# Patient Record
Sex: Female | Born: 1945
Health system: Southern US, Community
[De-identification: ages and names within clinical notes are randomized; demographics above are authoritative.]

## PROBLEM LIST (undated history)

## (undated) DIAGNOSIS — F209 Schizophrenia, unspecified: Secondary | ICD-10-CM

## (undated) DIAGNOSIS — F329 Major depressive disorder, single episode, unspecified: Secondary | ICD-10-CM

## (undated) DIAGNOSIS — F32A Depression, unspecified: Secondary | ICD-10-CM

## (undated) DIAGNOSIS — E7800 Pure hypercholesterolemia, unspecified: Secondary | ICD-10-CM

## (undated) DIAGNOSIS — I10 Essential (primary) hypertension: Secondary | ICD-10-CM

## (undated) HISTORY — PX: ABDOMINAL HYSTERECTOMY: SHX81

## (undated) HISTORY — PX: TOOTH EXTRACTION: SUR596

---

## 2003-01-01 ENCOUNTER — Encounter: Payer: Self-pay | Admitting: Cardiology

## 2003-01-01 ENCOUNTER — Encounter: Admission: RE | Admit: 2003-01-01 | Discharge: 2003-01-01 | Payer: Self-pay | Admitting: Cardiology

## 2004-05-21 ENCOUNTER — Emergency Department (HOSPITAL_COMMUNITY): Admission: EM | Admit: 2004-05-21 | Discharge: 2004-05-21 | Payer: Self-pay | Admitting: Emergency Medicine

## 2007-04-20 ENCOUNTER — Encounter: Admission: RE | Admit: 2007-04-20 | Discharge: 2007-04-20 | Payer: Self-pay | Admitting: Internal Medicine

## 2007-05-03 ENCOUNTER — Encounter: Admission: RE | Admit: 2007-05-03 | Discharge: 2007-05-03 | Payer: Self-pay | Admitting: Internal Medicine

## 2008-05-23 ENCOUNTER — Encounter: Admission: RE | Admit: 2008-05-23 | Discharge: 2008-05-23 | Payer: Self-pay | Admitting: Internal Medicine

## 2010-07-17 ENCOUNTER — Encounter: Admission: RE | Admit: 2010-07-17 | Discharge: 2010-07-17 | Payer: Self-pay | Admitting: Internal Medicine

## 2010-07-31 ENCOUNTER — Encounter: Admission: RE | Admit: 2010-07-31 | Discharge: 2010-07-31 | Payer: Self-pay | Admitting: Internal Medicine

## 2010-10-05 ENCOUNTER — Encounter: Payer: Self-pay | Admitting: Internal Medicine

## 2011-09-16 DIAGNOSIS — F209 Schizophrenia, unspecified: Secondary | ICD-10-CM | POA: Diagnosis not present

## 2012-02-17 DIAGNOSIS — Z833 Family history of diabetes mellitus: Secondary | ICD-10-CM | POA: Diagnosis not present

## 2012-02-17 DIAGNOSIS — I10 Essential (primary) hypertension: Secondary | ICD-10-CM | POA: Diagnosis not present

## 2012-02-17 DIAGNOSIS — E119 Type 2 diabetes mellitus without complications: Secondary | ICD-10-CM | POA: Diagnosis not present

## 2012-02-17 DIAGNOSIS — Z79899 Other long term (current) drug therapy: Secondary | ICD-10-CM | POA: Diagnosis not present

## 2012-02-17 DIAGNOSIS — E782 Mixed hyperlipidemia: Secondary | ICD-10-CM | POA: Diagnosis not present

## 2012-02-23 DIAGNOSIS — F209 Schizophrenia, unspecified: Secondary | ICD-10-CM | POA: Diagnosis not present

## 2012-06-02 DIAGNOSIS — F209 Schizophrenia, unspecified: Secondary | ICD-10-CM | POA: Diagnosis not present

## 2012-06-15 DIAGNOSIS — F209 Schizophrenia, unspecified: Secondary | ICD-10-CM | POA: Diagnosis not present

## 2012-06-17 ENCOUNTER — Emergency Department (HOSPITAL_COMMUNITY)
Admission: EM | Admit: 2012-06-17 | Discharge: 2012-06-18 | Disposition: A | Discharge status: Other Acute Inpatient Hospital | Payer: Medicare Other | Attending: Emergency Medicine | Admitting: Emergency Medicine

## 2012-06-17 ENCOUNTER — Encounter (HOSPITAL_COMMUNITY): Payer: Self-pay

## 2012-06-17 DIAGNOSIS — I10 Essential (primary) hypertension: Secondary | ICD-10-CM | POA: Insufficient documentation

## 2012-06-17 DIAGNOSIS — E119 Type 2 diabetes mellitus without complications: Secondary | ICD-10-CM | POA: Insufficient documentation

## 2012-06-17 DIAGNOSIS — F411 Generalized anxiety disorder: Secondary | ICD-10-CM | POA: Insufficient documentation

## 2012-06-17 DIAGNOSIS — R45851 Suicidal ideations: Secondary | ICD-10-CM | POA: Diagnosis not present

## 2012-06-17 DIAGNOSIS — F489 Nonpsychotic mental disorder, unspecified: Secondary | ICD-10-CM | POA: Diagnosis not present

## 2012-06-17 DIAGNOSIS — Z79899 Other long term (current) drug therapy: Secondary | ICD-10-CM | POA: Insufficient documentation

## 2012-06-17 DIAGNOSIS — F329 Major depressive disorder, single episode, unspecified: Secondary | ICD-10-CM | POA: Diagnosis not present

## 2012-06-17 DIAGNOSIS — F3289 Other specified depressive episodes: Secondary | ICD-10-CM | POA: Insufficient documentation

## 2012-06-17 HISTORY — DX: Essential (primary) hypertension: I10

## 2012-06-17 HISTORY — DX: Major depressive disorder, single episode, unspecified: F32.9

## 2012-06-17 HISTORY — DX: Depression, unspecified: F32.A

## 2012-06-17 LAB — CBC WITH DIFFERENTIAL/PLATELET
Hemoglobin: 12.9 g/dL (ref 12.0–15.0)
Lymphocytes Relative: 28 % (ref 12–46)
Lymphs Abs: 1 10*3/uL (ref 0.7–4.0)
MCH: 29.1 pg (ref 26.0–34.0)
Monocytes Relative: 11 % (ref 3–12)
Neutro Abs: 2.2 10*3/uL (ref 1.7–7.7)
Neutrophils Relative %: 59 % (ref 43–77)
Platelets: 267 10*3/uL (ref 150–400)
RBC: 4.44 MIL/uL (ref 3.87–5.11)
WBC: 3.7 10*3/uL — ABNORMAL LOW (ref 4.0–10.5)

## 2012-06-17 LAB — ETHANOL: Alcohol, Ethyl (B): <11 mg/dL (ref 0–11)

## 2012-06-17 LAB — RAPID URINE DRUG SCREEN, HOSP PERFORMED
Amphetamines: NOT DETECTED
Opiates: NOT DETECTED

## 2012-06-17 LAB — GLUCOSE, CAPILLARY: Glucose-Capillary: 118 mg/dL — ABNORMAL HIGH (ref 70–99)

## 2012-06-17 LAB — COMPREHENSIVE METABOLIC PANEL
ALT: 13 U/L (ref 0–35)
Alkaline Phosphatase: 44 U/L (ref 39–117)
BUN: 13 mg/dL (ref 6–23)
CO2: 28 mEq/L (ref 19–32)
Chloride: 104 mEq/L (ref 96–112)
GFR calc Af Amer: >90 mL/min (ref >90)
Glucose, Bld: 119 mg/dL — ABNORMAL HIGH (ref 70–99)
Potassium: 3.5 mEq/L (ref 3.5–5.1)
Sodium: 140 mEq/L (ref 135–145)
Total Bilirubin: 0.2 mg/dL — ABNORMAL LOW (ref 0.3–1.2)
Total Protein: 7.9 g/dL (ref 6.0–8.3)

## 2012-06-17 MED ORDER — INSULIN ASPART 100 UNIT/ML ~~LOC~~ SOLN: 0.0000 [IU] | Freq: Three times a day (TID) | SUBCUTANEOUS | Status: DC

## 2012-06-17 MED ORDER — LISINOPRIL 10 MG PO TABS
10.0000 mg | ORAL_TABLET | Freq: Every day | ORAL | Status: DC
  Administered 2012-06-18: 10 mg via ORAL
  Filled 2012-06-17: qty 1

## 2012-06-17 MED ORDER — HYDROCHLOROTHIAZIDE 25 MG PO TABS
25.0000 mg | ORAL_TABLET | Freq: Every day | ORAL | Status: DC
  Administered 2012-06-18: 25 mg via ORAL
  Filled 2012-06-17: qty 1

## 2012-06-17 MED ORDER — IBUPROFEN 600 MG PO TABS: 600.0000 mg | ORAL_TABLET | Freq: Three times a day (TID) | ORAL | Status: DC | PRN

## 2012-06-17 MED ORDER — LISINOPRIL-HYDROCHLOROTHIAZIDE 10-12.5 MG PO TABS: 1.0000 | ORAL_TABLET | Freq: Every day | ORAL | Status: DC

## 2012-06-17 MED ORDER — INSULIN ASPART 100 UNIT/ML ~~LOC~~ SOLN: 0.0000 [IU] | Freq: Every day | SUBCUTANEOUS | Status: DC

## 2012-06-17 MED ORDER — PIOGLITAZONE HCL 30 MG PO TABS
30.0000 mg | ORAL_TABLET | Freq: Every day | ORAL | Status: DC
  Administered 2012-06-18: 30 mg via ORAL
  Filled 2012-06-17: qty 1

## 2012-06-17 MED ORDER — LORAZEPAM 1 MG PO TABS: 1.0000 mg | ORAL_TABLET | Freq: Three times a day (TID) | ORAL | Status: DC | PRN

## 2012-06-17 MED ORDER — ACETAMINOPHEN 325 MG PO TABS: 650.0000 mg | ORAL_TABLET | ORAL | Status: DC | PRN

## 2012-06-17 MED ORDER — SIMVASTATIN 40 MG PO TABS
40.0000 mg | ORAL_TABLET | Freq: Every evening | ORAL | Status: DC
  Filled 2012-06-17: qty 1

## 2012-06-17 NOTE — ED Notes (Signed)
1 pt belongings bag moved to locker #35
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
## 2012-06-17 NOTE — ED Notes (Signed)
Telepsych notified of need for consult. Information faxed over.
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
## 2012-06-17 NOTE — ED Notes (Signed)
telepsych has been completed. 
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
## 2012-06-17 NOTE — ED Provider Notes (Signed)
Telepsych recommended inpatient psych. Appreciate recs.   Richardean Canal, MD 06/17/12 2226
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
## 2012-06-17 NOTE — ED Notes (Signed)
Pt is placed in scrubs and being wanded by security. [employer] at bedside

## 2012-06-17 NOTE — ED Provider Notes (Signed)
Medical screening examination/treatment/procedure(s) were performed by non-physician practitioner and as supervising physician I was immediately available for consultation/collaboration.   Richardean Canal, MD 06/17/12 [phone]

## 2012-06-18 DIAGNOSIS — E119 Type 2 diabetes mellitus without complications: Secondary | ICD-10-CM | POA: Diagnosis present

## 2012-06-18 DIAGNOSIS — I10 Essential (primary) hypertension: Secondary | ICD-10-CM | POA: Diagnosis not present

## 2012-06-18 DIAGNOSIS — R45850 Homicidal ideations: Secondary | ICD-10-CM | POA: Diagnosis not present

## 2012-06-18 DIAGNOSIS — F200 Paranoid schizophrenia: Secondary | ICD-10-CM | POA: Diagnosis not present

## 2012-06-18 DIAGNOSIS — K5900 Constipation, unspecified: Secondary | ICD-10-CM | POA: Diagnosis present

## 2012-06-18 DIAGNOSIS — Z9119 Patient's noncompliance with other medical treatment and regimen: Secondary | ICD-10-CM | POA: Diagnosis not present

## 2012-06-18 DIAGNOSIS — Z658 Other specified problems related to psychosocial circumstances: Secondary | ICD-10-CM | POA: Diagnosis not present

## 2012-06-18 DIAGNOSIS — E7800 Pure hypercholesterolemia, unspecified: Secondary | ICD-10-CM | POA: Diagnosis present

## 2012-06-18 LAB — GLUCOSE, CAPILLARY
Glucose-Capillary: 104 mg/dL — ABNORMAL HIGH (ref 70–99)
Glucose-Capillary: 107 mg/dL — ABNORMAL HIGH (ref 70–99)

## 2012-06-18 NOTE — ED Notes (Signed)
Sheriff's dept contacted for transportation

## 2012-06-18 NOTE — ED Notes (Signed)
Message left for patients niece to call- verbal permission from pt to tell her niece where she has been transfered too.

## 2012-06-18 NOTE — ED Notes (Signed)
Sheriff will transport around 3-4:00pm-pt aware

## 2012-06-18 NOTE — ED Provider Notes (Signed)
She has been accepted at [facility] by Dr. Mauri Pole.  Flint Melter, MD 06/18/12 [phone]

## 2012-06-18 NOTE — ED Notes (Signed)
Up to the bathroom 

## 2012-06-18 NOTE — BHH Counselor (Signed)
Per Gunnar Fusi, pt has been accepted at [facility] by Dr. Mauri Pole

## 2012-06-18 NOTE — ED Notes (Addendum)
Sheriff is here to transport-1 bag of belongings sent w/ pt.  Pt ambulatory w/ [facility] to [facility]. No c/o of pain discomfort

## 2012-06-18 NOTE — ED Notes (Signed)
This writer attempted to contact pt's niece at her request to inform her of transfer (pt lives w/ niece and does not know the phohe number). Number listed is not in service

## 2012-06-18 NOTE — ED Notes (Signed)
Pt's niece phone [phone]

## 2012-07-05 DIAGNOSIS — F209 Schizophrenia, unspecified: Secondary | ICD-10-CM | POA: Diagnosis not present

## 2012-07-13 DIAGNOSIS — F209 Schizophrenia, unspecified: Secondary | ICD-10-CM | POA: Diagnosis not present

## 2012-08-26 DIAGNOSIS — F209 Schizophrenia, unspecified: Secondary | ICD-10-CM | POA: Diagnosis not present

## 2012-10-19 DIAGNOSIS — F209 Schizophrenia, unspecified: Secondary | ICD-10-CM | POA: Diagnosis not present

## 2012-11-02 DIAGNOSIS — F209 Schizophrenia, unspecified: Secondary | ICD-10-CM | POA: Diagnosis not present

## 2012-11-16 DIAGNOSIS — F209 Schizophrenia, unspecified: Secondary | ICD-10-CM | POA: Diagnosis not present

## 2012-11-21 DIAGNOSIS — Z0000 Encounter for general adult medical examination without abnormal findings: Secondary | ICD-10-CM | POA: Diagnosis not present

## 2012-11-21 DIAGNOSIS — Z79899 Other long term (current) drug therapy: Secondary | ICD-10-CM | POA: Diagnosis not present

## 2012-11-21 DIAGNOSIS — E559 Vitamin D deficiency, unspecified: Secondary | ICD-10-CM | POA: Diagnosis not present

## 2012-11-21 DIAGNOSIS — N3945 Continuous leakage: Secondary | ICD-10-CM | POA: Diagnosis not present

## 2012-12-02 DIAGNOSIS — F209 Schizophrenia, unspecified: Secondary | ICD-10-CM | POA: Diagnosis not present

## 2012-12-14 DIAGNOSIS — F209 Schizophrenia, unspecified: Secondary | ICD-10-CM | POA: Diagnosis not present

## 2012-12-21 DIAGNOSIS — F209 Schizophrenia, unspecified: Secondary | ICD-10-CM | POA: Diagnosis not present

## 2012-12-28 DIAGNOSIS — F209 Schizophrenia, unspecified: Secondary | ICD-10-CM | POA: Diagnosis not present

## 2013-01-03 DIAGNOSIS — Z1211 Encounter for screening for malignant neoplasm of colon: Secondary | ICD-10-CM | POA: Diagnosis not present

## 2013-01-03 DIAGNOSIS — K5900 Constipation, unspecified: Secondary | ICD-10-CM | POA: Diagnosis not present

## 2013-01-11 DIAGNOSIS — F209 Schizophrenia, unspecified: Secondary | ICD-10-CM | POA: Diagnosis not present

## 2013-01-25 DIAGNOSIS — F209 Schizophrenia, unspecified: Secondary | ICD-10-CM | POA: Diagnosis not present

## 2013-02-08 DIAGNOSIS — F209 Schizophrenia, unspecified: Secondary | ICD-10-CM | POA: Diagnosis not present

## 2013-02-21 DIAGNOSIS — E785 Hyperlipidemia, unspecified: Secondary | ICD-10-CM | POA: Diagnosis not present

## 2013-02-21 DIAGNOSIS — E119 Type 2 diabetes mellitus without complications: Secondary | ICD-10-CM | POA: Diagnosis not present

## 2013-02-21 DIAGNOSIS — Z79899 Other long term (current) drug therapy: Secondary | ICD-10-CM | POA: Diagnosis not present

## 2013-02-21 DIAGNOSIS — I10 Essential (primary) hypertension: Secondary | ICD-10-CM | POA: Diagnosis not present

## 2013-02-21 DIAGNOSIS — E559 Vitamin D deficiency, unspecified: Secondary | ICD-10-CM | POA: Diagnosis not present

## 2013-02-22 DIAGNOSIS — F209 Schizophrenia, unspecified: Secondary | ICD-10-CM | POA: Diagnosis not present

## 2013-03-08 DIAGNOSIS — F209 Schizophrenia, unspecified: Secondary | ICD-10-CM | POA: Diagnosis not present

## 2013-03-22 DIAGNOSIS — F209 Schizophrenia, unspecified: Secondary | ICD-10-CM | POA: Diagnosis not present

## 2013-04-05 DIAGNOSIS — F209 Schizophrenia, unspecified: Secondary | ICD-10-CM | POA: Diagnosis not present

## 2013-04-19 DIAGNOSIS — F209 Schizophrenia, unspecified: Secondary | ICD-10-CM | POA: Diagnosis not present

## 2013-05-03 DIAGNOSIS — F209 Schizophrenia, unspecified: Secondary | ICD-10-CM | POA: Diagnosis not present

## 2013-05-17 DIAGNOSIS — F209 Schizophrenia, unspecified: Secondary | ICD-10-CM | POA: Diagnosis not present

## 2013-05-31 DIAGNOSIS — F209 Schizophrenia, unspecified: Secondary | ICD-10-CM | POA: Diagnosis not present

## 2013-06-14 DIAGNOSIS — F209 Schizophrenia, unspecified: Secondary | ICD-10-CM | POA: Diagnosis not present

## 2013-06-28 DIAGNOSIS — F209 Schizophrenia, unspecified: Secondary | ICD-10-CM | POA: Diagnosis not present

## 2013-07-12 DIAGNOSIS — F209 Schizophrenia, unspecified: Secondary | ICD-10-CM | POA: Diagnosis not present

## 2013-07-26 DIAGNOSIS — F209 Schizophrenia, unspecified: Secondary | ICD-10-CM | POA: Diagnosis not present

## 2013-08-09 DIAGNOSIS — F209 Schizophrenia, unspecified: Secondary | ICD-10-CM | POA: Diagnosis not present

## 2013-08-22 DIAGNOSIS — F209 Schizophrenia, unspecified: Secondary | ICD-10-CM | POA: Diagnosis not present

## 2013-09-06 DIAGNOSIS — F209 Schizophrenia, unspecified: Secondary | ICD-10-CM | POA: Diagnosis not present

## 2013-09-19 DIAGNOSIS — E785 Hyperlipidemia, unspecified: Secondary | ICD-10-CM | POA: Diagnosis not present

## 2013-09-19 DIAGNOSIS — Z79899 Other long term (current) drug therapy: Secondary | ICD-10-CM | POA: Diagnosis not present

## 2013-09-19 DIAGNOSIS — I10 Essential (primary) hypertension: Secondary | ICD-10-CM | POA: Diagnosis not present

## 2013-09-19 DIAGNOSIS — E559 Vitamin D deficiency, unspecified: Secondary | ICD-10-CM | POA: Diagnosis not present

## 2013-09-19 DIAGNOSIS — E119 Type 2 diabetes mellitus without complications: Secondary | ICD-10-CM | POA: Diagnosis not present

## 2013-09-20 DIAGNOSIS — F209 Schizophrenia, unspecified: Secondary | ICD-10-CM | POA: Diagnosis not present

## 2013-10-04 DIAGNOSIS — F209 Schizophrenia, unspecified: Secondary | ICD-10-CM | POA: Diagnosis not present

## 2013-10-18 DIAGNOSIS — F209 Schizophrenia, unspecified: Secondary | ICD-10-CM | POA: Diagnosis not present

## 2013-11-01 DIAGNOSIS — F209 Schizophrenia, unspecified: Secondary | ICD-10-CM | POA: Diagnosis not present

## 2013-11-15 DIAGNOSIS — F209 Schizophrenia, unspecified: Secondary | ICD-10-CM | POA: Diagnosis not present

## 2013-12-01 DIAGNOSIS — F209 Schizophrenia, unspecified: Secondary | ICD-10-CM | POA: Diagnosis not present

## 2013-12-13 DIAGNOSIS — F209 Schizophrenia, unspecified: Secondary | ICD-10-CM | POA: Diagnosis not present

## 2013-12-27 DIAGNOSIS — F209 Schizophrenia, unspecified: Secondary | ICD-10-CM | POA: Diagnosis not present

## 2014-01-10 DIAGNOSIS — F209 Schizophrenia, unspecified: Secondary | ICD-10-CM | POA: Diagnosis not present

## 2014-01-16 DIAGNOSIS — E782 Mixed hyperlipidemia: Secondary | ICD-10-CM | POA: Diagnosis not present

## 2014-01-16 DIAGNOSIS — E119 Type 2 diabetes mellitus without complications: Secondary | ICD-10-CM | POA: Diagnosis not present

## 2014-01-16 DIAGNOSIS — Z1239 Encounter for other screening for malignant neoplasm of breast: Secondary | ICD-10-CM | POA: Diagnosis not present

## 2014-01-16 DIAGNOSIS — E559 Vitamin D deficiency, unspecified: Secondary | ICD-10-CM | POA: Diagnosis not present

## 2014-01-16 DIAGNOSIS — I10 Essential (primary) hypertension: Secondary | ICD-10-CM | POA: Diagnosis not present

## 2014-01-16 DIAGNOSIS — Z0000 Encounter for general adult medical examination without abnormal findings: Secondary | ICD-10-CM | POA: Diagnosis not present

## 2014-01-24 DIAGNOSIS — F209 Schizophrenia, unspecified: Secondary | ICD-10-CM | POA: Diagnosis not present

## 2014-02-07 DIAGNOSIS — F209 Schizophrenia, unspecified: Secondary | ICD-10-CM | POA: Diagnosis not present

## 2014-02-21 DIAGNOSIS — F209 Schizophrenia, unspecified: Secondary | ICD-10-CM | POA: Diagnosis not present

## 2014-03-07 DIAGNOSIS — F209 Schizophrenia, unspecified: Secondary | ICD-10-CM | POA: Diagnosis not present

## 2014-03-21 DIAGNOSIS — F209 Schizophrenia, unspecified: Secondary | ICD-10-CM | POA: Diagnosis not present

## 2014-04-04 DIAGNOSIS — F209 Schizophrenia, unspecified: Secondary | ICD-10-CM | POA: Diagnosis not present

## 2014-04-18 DIAGNOSIS — F209 Schizophrenia, unspecified: Secondary | ICD-10-CM | POA: Diagnosis not present

## 2014-05-01 DIAGNOSIS — F209 Schizophrenia, unspecified: Secondary | ICD-10-CM | POA: Diagnosis not present

## 2014-05-15 DIAGNOSIS — F209 Schizophrenia, unspecified: Secondary | ICD-10-CM | POA: Diagnosis not present

## 2014-06-12 DIAGNOSIS — F209 Schizophrenia, unspecified: Secondary | ICD-10-CM | POA: Diagnosis not present

## 2014-07-18 DIAGNOSIS — Z79899 Other long term (current) drug therapy: Secondary | ICD-10-CM | POA: Diagnosis not present

## 2014-07-18 DIAGNOSIS — E119 Type 2 diabetes mellitus without complications: Secondary | ICD-10-CM | POA: Diagnosis not present

## 2014-07-18 DIAGNOSIS — E782 Mixed hyperlipidemia: Secondary | ICD-10-CM | POA: Diagnosis not present

## 2014-07-18 DIAGNOSIS — Z23 Encounter for immunization: Secondary | ICD-10-CM | POA: Diagnosis not present

## 2014-07-18 DIAGNOSIS — R635 Abnormal weight gain: Secondary | ICD-10-CM | POA: Diagnosis not present

## 2014-07-18 DIAGNOSIS — I10 Essential (primary) hypertension: Secondary | ICD-10-CM | POA: Diagnosis not present

## 2014-08-28 DIAGNOSIS — F209 Schizophrenia, unspecified: Secondary | ICD-10-CM | POA: Diagnosis not present

## 2014-10-19 DIAGNOSIS — I10 Essential (primary) hypertension: Secondary | ICD-10-CM | POA: Diagnosis not present

## 2014-10-19 DIAGNOSIS — F209 Schizophrenia, unspecified: Secondary | ICD-10-CM | POA: Diagnosis not present

## 2014-10-19 DIAGNOSIS — E119 Type 2 diabetes mellitus without complications: Secondary | ICD-10-CM | POA: Diagnosis not present

## 2014-10-19 DIAGNOSIS — Z79899 Other long term (current) drug therapy: Secondary | ICD-10-CM | POA: Diagnosis not present

## 2014-10-19 DIAGNOSIS — E782 Mixed hyperlipidemia: Secondary | ICD-10-CM | POA: Diagnosis not present

## 2014-11-19 DIAGNOSIS — F209 Schizophrenia, unspecified: Secondary | ICD-10-CM | POA: Diagnosis not present

## 2015-01-28 ENCOUNTER — Encounter (HOSPITAL_COMMUNITY): Payer: Self-pay | Admitting: Emergency Medicine

## 2015-01-28 ENCOUNTER — Emergency Department (HOSPITAL_COMMUNITY)
Admission: EM | Admit: 2015-01-28 | Discharge: 2015-01-30 | Disposition: A | Discharge status: Other Acute Inpatient Hospital | Payer: Medicare Other | Attending: Emergency Medicine | Admitting: Emergency Medicine

## 2015-01-28 DIAGNOSIS — I10 Essential (primary) hypertension: Secondary | ICD-10-CM | POA: Insufficient documentation

## 2015-01-28 DIAGNOSIS — Z008 Encounter for other general examination: Secondary | ICD-10-CM | POA: Diagnosis present

## 2015-01-28 DIAGNOSIS — Z79899 Other long term (current) drug therapy: Secondary | ICD-10-CM | POA: Diagnosis not present

## 2015-01-28 DIAGNOSIS — F329 Major depressive disorder, single episode, unspecified: Secondary | ICD-10-CM | POA: Diagnosis not present

## 2015-01-28 DIAGNOSIS — F1310 Sedative, hypnotic or anxiolytic abuse, uncomplicated: Secondary | ICD-10-CM | POA: Insufficient documentation

## 2015-01-28 DIAGNOSIS — F259 Schizoaffective disorder, unspecified: Secondary | ICD-10-CM

## 2015-01-28 DIAGNOSIS — F209 Schizophrenia, unspecified: Secondary | ICD-10-CM | POA: Diagnosis not present

## 2015-01-28 DIAGNOSIS — R440 Auditory hallucinations: Secondary | ICD-10-CM

## 2015-01-28 DIAGNOSIS — E119 Type 2 diabetes mellitus without complications: Secondary | ICD-10-CM | POA: Diagnosis not present

## 2015-01-28 DIAGNOSIS — E780 Pure hypercholesterolemia: Secondary | ICD-10-CM | POA: Diagnosis not present

## 2015-01-28 DIAGNOSIS — F0390 Unspecified dementia without behavioral disturbance: Secondary | ICD-10-CM

## 2015-01-28 DIAGNOSIS — F4489 Other dissociative and conversion disorders: Secondary | ICD-10-CM | POA: Diagnosis not present

## 2015-01-28 DIAGNOSIS — I6789 Other cerebrovascular disease: Secondary | ICD-10-CM | POA: Diagnosis not present

## 2015-01-28 DIAGNOSIS — F29 Unspecified psychosis not due to a substance or known physiological condition: Secondary | ICD-10-CM | POA: Diagnosis not present

## 2015-01-28 HISTORY — DX: Pure hypercholesterolemia, unspecified: E78.00

## 2015-01-28 LAB — ACETAMINOPHEN LEVEL: Acetaminophen (Tylenol), Serum: <10 ug/mL — ABNORMAL LOW (ref 10–30)

## 2015-01-28 LAB — COMPREHENSIVE METABOLIC PANEL
ALBUMIN: 3.5 g/dL (ref 3.5–5.0)
ALK PHOS: 40 U/L (ref 38–126)
ALT: 15 U/L (ref 14–54)
AST: 16 U/L (ref 15–41)
Anion gap: 8 (ref 5–15)
BILIRUBIN TOTAL: 0.5 mg/dL (ref 0.3–1.2)
BUN: 20 mg/dL (ref 6–20)
CALCIUM: 9.6 mg/dL (ref 8.9–10.3)
CO2: 27 mmol/L (ref 22–32)
CREATININE: 0.85 mg/dL (ref 0.44–1.00)
Chloride: 105 mmol/L (ref 101–111)
GFR calc Af Amer: >60 mL/min (ref >60)
GFR calc non Af Amer: >60 mL/min (ref >60)
Glucose, Bld: 110 mg/dL — ABNORMAL HIGH (ref 65–99)
Potassium: 3.6 mmol/L (ref 3.5–5.1)
Sodium: 140 mmol/L (ref 135–145)
TOTAL PROTEIN: 7.7 g/dL (ref 6.5–8.1)

## 2015-01-28 LAB — CBC
HEMATOCRIT: 38.9 % (ref 36.0–46.0)
HEMOGLOBIN: 12.1 g/dL (ref 12.0–15.0)
MCH: 28.4 pg (ref 26.0–34.0)
MCHC: 31.1 g/dL (ref 30.0–36.0)
MCV: 91.3 fL (ref 78.0–100.0)
Platelets: 204 10*3/uL (ref 150–400)
RBC: 4.26 MIL/uL (ref 3.87–5.11)
RDW: 15.3 % (ref 11.5–15.5)
WBC: 4 10*3/uL (ref 4.0–10.5)

## 2015-01-28 LAB — ETHANOL: Alcohol, Ethyl (B): <5 mg/dL (ref <5)

## 2015-01-28 LAB — SALICYLATE LEVEL

## 2015-01-28 LAB — RAPID URINE DRUG SCREEN, HOSP PERFORMED
Amphetamines: NOT DETECTED
BENZODIAZEPINES: POSITIVE — AB
Barbiturates: NOT DETECTED
COCAINE: NOT DETECTED
OPIATES: NOT DETECTED
Tetrahydrocannabinol: NOT DETECTED

## 2015-01-28 MED ORDER — NICOTINE 21 MG/24HR TD PT24: 21.0000 mg | MEDICATED_PATCH | Freq: Every day | TRANSDERMAL | Status: DC

## 2015-01-28 MED ORDER — ONDANSETRON HCL 4 MG PO TABS: 4.0000 mg | ORAL_TABLET | Freq: Three times a day (TID) | ORAL | Status: DC | PRN

## 2015-01-28 MED ORDER — IBUPROFEN 200 MG PO TABS: 600.0000 mg | ORAL_TABLET | Freq: Three times a day (TID) | ORAL | Status: DC | PRN

## 2015-01-28 MED ORDER — ALUM & MAG HYDROXIDE-SIMETH 200-200-20 MG/5ML PO SUSP: 30.0000 mL | ORAL | Status: DC | PRN

## 2015-01-28 MED ORDER — ACETAMINOPHEN 325 MG PO TABS: 650.0000 mg | ORAL_TABLET | ORAL | Status: DC | PRN

## 2015-01-28 MED ORDER — LORAZEPAM 1 MG PO TABS: 1.0000 mg | ORAL_TABLET | Freq: Three times a day (TID) | ORAL | Status: DC | PRN

## 2015-01-28 MED ORDER — ZOLPIDEM TARTRATE 5 MG PO TABS: 5.0000 mg | ORAL_TABLET | Freq: Every evening | ORAL | Status: DC | PRN
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
## 2015-01-28 NOTE — ED Notes (Signed)
Bed: [id number] Expected date:  Expected time:  Means of arrival:  Comments: EMS 69 yo female IVC
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
## 2015-01-28 NOTE — ED Notes (Signed)
Per EMS-patient reports patient has been more aggressive today. Hx schizophrenia. Last time patient was under IVC was two years ago. On arrival patient did not want to go, was handcuffed and placed on bed. Calm with EMS en route. Alert but disoriented. Pt from home-lives with family members. VS: BP 134/76 SPO2 100% on RA. HR 92 CBG 106 mg/dl. Hx DM.

## 2015-01-28 NOTE — ED Provider Notes (Signed)
CSN: [id number]     Arrival date & time 01/28/15  2044 History   First MD Initiated Contact with Patient 01/28/15 2047     Chief Complaint  Patient presents with  . IVC      (Consider location/radiation/quality/duration/timing/severity/associated sxs/prior Treatment) The history is provided by the patient and a relative.     Patient with hx schizophrenia, depression brought in by police under IVC from mobile crisis for auditory hallucinations, bizarre behavior, aggression increasing over the past two weeks.   Niece provides history.  Niece states the patient lives with her, has beengetting dressed and leaving the house in the middle of the night stating there is someone downstairs talking to her (there is no downstairs in their house).  She also states her husband is downstairs making love to another woman and her children are downstairs.  (not only is there no downstairs, the patient has no husband or children).  Niece has heard patient screaming in outbursts in her room and cursing.  Today she became more hostile and angry, becoming physically violent in the house, refused to go to the doctor.    Pt denies any pain, any depression or SI.  Denies any physical symptoms currently.    Past Medical History  Diagnosis Date  . Depression   . Hypertension   . Diabetes mellitus   . Hypercholesterolemia    Past Surgical History  Procedure Laterality Date  . Abdominal hysterectomy    . Tooth extraction     History reviewed. No pertinent family history. History  Substance Use Topics  . Smoking status: Never Smoker   . Smokeless tobacco: Not on file  . Alcohol Use: No   OB History    No data available     Review of Systems  All other systems reviewed and are negative.     Allergies  Review of patient's allergies indicates no known allergies.  Home Medications   Prior to Admission medications   Medication Sig Start Date End Date Taking? Authorizing Provider  benztropine  (COGENTIN) 1 MG tablet Take 1 mg by mouth at bedtime.    Historical Provider, MD  FLUoxetine (PROZAC) 10 MG capsule Take 10-20 mg by mouth at bedtime.    Historical Provider, MD  hydrochlorothiazide (HYDRODIURIL) 25 MG tablet Take 25 mg by mouth daily.    Historical Provider, MD  lisinopril-hydrochlorothiazide (PRINZIDE,ZESTORETIC) 10-12.5 MG per tablet Take 1 tablet by mouth daily.    Historical Provider, MD  pioglitazone (ACTOS) 30 MG tablet Take 30 mg by mouth daily.    Historical Provider, MD  simvastatin (ZOCOR) 40 MG tablet Take 40 mg by mouth every evening.    Historical Provider, MD   BP 98/62 mmHg  Pulse 88  Temp(Src) 98.4 F (36.9 C) (Oral)  Resp 18  SpO2 99% Physical Exam  Constitutional: She appears well-developed and well-nourished. No distress.  HENT:  Head: Normocephalic and atraumatic.  Eyes: Conjunctivae are normal.  Neck: Neck supple.  Cardiovascular: Normal rate and regular rhythm.   Pulmonary/Chest: Effort normal and breath sounds normal. No respiratory distress. She has no wheezes. She has no rales.  Abdominal: Soft. She exhibits no distension. There is no tenderness. There is no rebound and no guarding.  Musculoskeletal: She exhibits no edema.  Neurological: She is alert.  Skin: She is not diaphoretic.  Psychiatric: Her speech is normal. Her affect is blunt. She expresses no suicidal ideation.  Calm, cooperative  Nursing note and vitals reviewed.   ED Course  Procedures (  including critical care time) Labs Review Labs Reviewed  ACETAMINOPHEN LEVEL - Abnormal; Notable for the following:    Acetaminophen (Tylenol), Serum <10 (*)    All other components within normal limits  COMPREHENSIVE METABOLIC PANEL - Abnormal; Notable for the following:    Glucose, Bld 110 (*)    All other components within normal limits  URINE RAPID DRUG SCREEN (HOSP PERFORMED) - Abnormal; Notable for the following:    Benzodiazepines POSITIVE (*)    All other components within  normal limits  CBC  ETHANOL  SALICYLATE LEVEL    Imaging Review No results found.   EKG Interpretation None       Dr Patria Maneampos made aware of patient.   MDM   Final diagnoses:  Schizophrenia, unspecified type  Auditory hallucinations    Pt with hx schizophrenia with worsening symptoms of auditory hallucinations and behavioral disturbance over the past two weeks.  Under IVC.   Medically cleared.  Pending TTS consult, anticipate admission.      Trixie Dredgemily Vershawn Westrup, PA-C 01/29/15 [id number]  Azalia BilisKevin Campos, MD 01/29/15 [phone]

## 2015-01-29 ENCOUNTER — Emergency Department (HOSPITAL_COMMUNITY): Payer: Medicare Other

## 2015-01-29 DIAGNOSIS — F259 Schizoaffective disorder, unspecified: Secondary | ICD-10-CM

## 2015-01-29 DIAGNOSIS — F29 Unspecified psychosis not due to a substance or known physiological condition: Secondary | ICD-10-CM | POA: Diagnosis not present

## 2015-01-29 NOTE — ED Notes (Signed)
Report was given [facility]

## 2015-01-29 NOTE — ED Notes (Signed)
Patient is resting comfortably. 

## 2015-01-29 NOTE — ED Notes (Signed)
Attempted to [facility] to [facility], will call writer back.

## 2015-01-29 NOTE — ED Provider Notes (Signed)
  Physical Exam  BP 137/53 mmHg  Pulse 77  Temp(Src) 98.7 F (37.1 C) (Oral)  Resp 18  SpO2 97%  Physical Exam  ED Course  Procedures  [facility] under Dr. Larey SeatSufferen. Stable for transfer.   Richardean Canalavid H Yao, MD 01/29/15 [phone]

## 2015-01-29 NOTE — Progress Notes (Signed)
CSW requested NP to place orders for EKG, UA, and CXR as requested by facilities for geropsychiatry placement.   Pt recommended for inpatient geri psych.  Pt referred to:  [facility]-under review  [facility]-at capacity [facility]-under review  [facility]-at capacity [facility]-at capacity  [facility]-under review  [facility]-at capacity   Olga CoasterKristen Otillia Cordone, LCSW  Clinical Social Work  [facility] [phone]

## 2015-01-29 NOTE — ED Notes (Signed)
Sitter assist with ambulation and adl's. Patien in NAD.

## 2015-01-29 NOTE — ED Notes (Signed)
Call to [facility].

## 2015-01-29 NOTE — Progress Notes (Addendum)
CSW was notified [facility] that the pt has been accepted to their [facility].  Accepting Doctor: Willaim RayasSamantha Sufferen Report Number [phone]  CSW made nurse aware.   Trish MageBrittney Adric Wrede, LCSWA [phone] ED CSW 01/29/2015  6:35 PM

## 2015-01-30 DIAGNOSIS — E119 Type 2 diabetes mellitus without complications: Secondary | ICD-10-CM | POA: Diagnosis present

## 2015-01-30 DIAGNOSIS — Z90710 Acquired absence of both cervix and uterus: Secondary | ICD-10-CM | POA: Diagnosis not present

## 2015-01-30 DIAGNOSIS — I10 Essential (primary) hypertension: Secondary | ICD-10-CM | POA: Diagnosis present

## 2015-01-30 DIAGNOSIS — E785 Hyperlipidemia, unspecified: Secondary | ICD-10-CM | POA: Diagnosis present

## 2015-01-30 DIAGNOSIS — N390 Urinary tract infection, site not specified: Secondary | ICD-10-CM | POA: Diagnosis not present

## 2015-01-30 DIAGNOSIS — F28 Other psychotic disorder not due to a substance or known physiological condition: Secondary | ICD-10-CM | POA: Diagnosis present

## 2015-01-30 DIAGNOSIS — F209 Schizophrenia, unspecified: Secondary | ICD-10-CM | POA: Diagnosis not present

## 2015-01-30 DIAGNOSIS — F200 Paranoid schizophrenia: Secondary | ICD-10-CM | POA: Diagnosis not present

## 2015-01-30 DIAGNOSIS — Z79899 Other long term (current) drug therapy: Secondary | ICD-10-CM | POA: Diagnosis not present

## 2015-01-30 DIAGNOSIS — K5900 Constipation, unspecified: Secondary | ICD-10-CM | POA: Diagnosis present

## 2015-01-30 DIAGNOSIS — F329 Major depressive disorder, single episode, unspecified: Secondary | ICD-10-CM | POA: Diagnosis present

## 2015-01-30 DIAGNOSIS — B9620 Unspecified Escherichia coli [E. coli] as the cause of diseases classified elsewhere: Secondary | ICD-10-CM | POA: Diagnosis not present

## 2015-01-30 DIAGNOSIS — Z880 Allergy status to penicillin: Secondary | ICD-10-CM | POA: Diagnosis not present

## 2015-01-30 NOTE — Consult Note (Signed)
[facility] Psychiatry Consult   Reason for Consult:  Psychiatric Evaluation Referring Physician:  EDP Patient Identification: Joan Nash MRN:  [id number] Principal Diagnosis: Schizoaffective disorder Diagnosis:   Patient Active Problem List   Diagnosis Date Noted  . Schizoaffective disorder [F25.9] 01/29/2015    Total Time spent with patient: 35 minutes  Subjective:   Joan Nash is a 69 y.o. female who states "my niece said I need a little more help with my mind."  HPI:  Joan Nash is a 69 yo [place] American female who was brought to the Emergency Department from mobile crisis for auditory hallucinations, bizarre behavior, and aggression.  She has been having outbursts of cursing and becomes hostile easily and is easily angered. She reports today that she "feels tormented." She denies suicidal or homicidal ideation intent or plan. She states she sees "people fighting against me." She states she is compliant with her medications because her niece gives them to her. She denies alcohol or illicit drug use.   HPI Elements:   Location:  Mood. Quality:  aggressive behavior with visual hallucinations. Severity:  Severe. Timing:  Acute. Duration:  Worsening over the past 2 weeks. Context:  stressors.  Past Medical History:  Past Medical History  Diagnosis Date  . Depression   . Hypertension   . Diabetes mellitus   . Hypercholesterolemia     Past Surgical History  Procedure Laterality Date  . Abdominal hysterectomy    . Tooth extraction     Family History: History reviewed. No pertinent family history. Social History:  History  Alcohol Use No     History  Drug Use No    History   Social History  . Marital Status: Single    Spouse Name: N/A  . Number of Children: N/A  . Years of Education: N/A   Social History Main Topics  . Smoking status: Never Smoker   . Smokeless tobacco: Not on file  . Alcohol Use: No  . Drug Use: No  . Sexual Activity: Not on file    Other Topics Concern  . None   Social History Narrative   Additional Social History:    Pain Medications: See PTA medication list  Prescriptions: See PTA medication list Over the Counter: See PTA medication list History of alcohol / drug use?: No history of alcohol / drug abuse                     Allergies:   Allergies  Allergen Reactions  . Penicillins Anaphylaxis    Labs:  Results for orders placed or performed during the hospital encounter of 01/28/15 (from the past 48 hour(s))  Urine Drug Screen     Status: Abnormal   Collection Time: 01/28/15  9:11 PM  Result Value Ref Range   Opiates NONE DETECTED NONE DETECTED   Cocaine NONE DETECTED NONE DETECTED   Benzodiazepines POSITIVE (A) NONE DETECTED   Amphetamines NONE DETECTED NONE DETECTED   Tetrahydrocannabinol NONE DETECTED NONE DETECTED   Barbiturates NONE DETECTED NONE DETECTED    Comment:        DRUG SCREEN FOR MEDICAL PURPOSES ONLY.  IF CONFIRMATION IS NEEDED FOR ANY PURPOSE, NOTIFY LAB WITHIN 5 DAYS.        LOWEST DETECTABLE LIMITS FOR URINE DRUG SCREEN Drug Class       Cutoff (ng/mL) Amphetamine      1000 Barbiturate      200 Benzodiazepine   786 Tricyclics       767  Opiates          300 Cocaine          300 THC              50   Acetaminophen level     Status: Abnormal   Collection Time: 01/28/15  9:27 PM  Result Value Ref Range   Acetaminophen (Tylenol), Serum <10 (L) 10 - 30 ug/mL    Comment:        THERAPEUTIC CONCENTRATIONS VARY SIGNIFICANTLY. A RANGE OF 10-30 ug/mL MAY BE AN EFFECTIVE CONCENTRATION FOR MANY PATIENTS. HOWEVER, SOME ARE BEST TREATED AT CONCENTRATIONS OUTSIDE THIS RANGE. ACETAMINOPHEN CONCENTRATIONS >150 ug/mL AT 4 HOURS AFTER INGESTION AND >50 ug/mL AT 12 HOURS AFTER INGESTION ARE OFTEN ASSOCIATED WITH TOXIC REACTIONS.   CBC     Status: None   Collection Time: 01/28/15  9:27 PM  Result Value Ref Range   WBC 4.0 4.0 - 10.5 K/uL   RBC 4.26 3.87 - 5.11  MIL/uL   Hemoglobin 12.1 12.0 - 15.0 g/dL   HCT 38.9 36.0 - 46.0 %   MCV 91.3 78.0 - 100.0 fL   MCH 28.4 26.0 - 34.0 pg   MCHC 31.1 30.0 - 36.0 g/dL   RDW 15.3 11.5 - 15.5 %   Platelets 204 150 - 400 K/uL  Comprehensive metabolic panel     Status: Abnormal   Collection Time: 01/28/15  9:27 PM  Result Value Ref Range   Sodium 140 135 - 145 mmol/L   Potassium 3.6 3.5 - 5.1 mmol/L   Chloride 105 101 - 111 mmol/L   CO2 27 22 - 32 mmol/L   Glucose, Bld 110 (H) 65 - 99 mg/dL   BUN 20 6 - 20 mg/dL   Creatinine, Ser 0.85 0.44 - 1.00 mg/dL   Calcium 9.6 8.9 - 10.3 mg/dL   Total Protein 7.7 6.5 - 8.1 g/dL   Albumin 3.5 3.5 - 5.0 g/dL   AST 16 15 - 41 U/L   ALT 15 14 - 54 U/L   Alkaline Phosphatase 40 38 - 126 U/L   Total Bilirubin 0.5 0.3 - 1.2 mg/dL   GFR calc non Af Amer >60 >60 mL/min   GFR calc Af Amer >60 >60 mL/min    Comment: (NOTE) The eGFR has been calculated using the CKD EPI equation. This calculation has not been validated in all clinical situations. eGFR's persistently <60 mL/min signify possible Chronic Kidney Disease.    Anion gap 8 5 - 15  Ethanol (ETOH)     Status: None   Collection Time: 01/28/15  9:27 PM  Result Value Ref Range   Alcohol, Ethyl (B) <5 <5 mg/dL    Comment:        LOWEST DETECTABLE LIMIT FOR SERUM ALCOHOL IS 11 mg/dL FOR MEDICAL PURPOSES ONLY   Salicylate level     Status: None   Collection Time: 01/28/15  9:27 PM  Result Value Ref Range   Salicylate Lvl <0.0 2.8 - 30.0 mg/dL    Vitals: Blood pressure 144/66, pulse 81, temperature 98.6 F (37 C), temperature source Oral, resp. rate 20, SpO2 100 %.  Risk to Self: Suicidal Ideation: No Suicidal Intent: No Is patient at risk for suicide?: No Suicidal Plan?: No Access to Means: No What has been your use of drugs/alcohol within the last 12 months?: N/A How many times?: 0 Other Self Harm Risks: Has hit her head on wall in the last 2 weeks. Triggers for Past Attempts: None  known Intentional Self Injurious Behavior: Damaging (Will get upset and hit her head on the wall. ) Comment - Self Injurious Behavior: Will hit head on wall. Risk to Others: Homicidal Ideation: No Thoughts of Harm to Others: No Current Homicidal Intent: No Current Homicidal Plan: No Access to Homicidal Means: No Identified Victim: No one History of harm to others?: Yes Assessment of Violence: On admission Violent Behavior Description: Yelling at niece, walking aggressively towards her. Does patient have access to weapons?: No Criminal Charges Pending?: No Does patient have a court date: No Prior Inpatient Therapy: Prior Inpatient Therapy: Yes Prior Therapy Dates: Two years ago almost Prior Therapy Facilty/Provider(s): [facility] Reason for Treatment: med adjustment Prior Outpatient Therapy: Prior Outpatient Therapy: Yes Prior Therapy Dates: 10 years or more Prior Therapy Facilty/Provider([facility]): [facility] then [facility] Reason for Treatment: medication monitoring Does patient have an ACCT team?: No Does patient have Intensive In-House Services?  : No Does patient have [facility] services? : Yes Does patient have P4CC services?: No  Current Facility-Administered Medications  Medication Dose Route Frequency Provider Last Rate Last Dose  . acetaminophen (TYLENOL) tablet 650 mg  650 mg Oral Q4H PRN Clayton Bibles, PA-C      . alum & mag hydroxide-simeth (MAALOX/MYLANTA) 200-200-20 MG/5ML suspension 30 mL  30 mL Oral PRN Clayton Bibles, PA-C      . ibuprofen (ADVIL,MOTRIN) tablet 600 mg  600 mg Oral Q8H PRN Clayton Bibles, PA-C      . LORazepam (ATIVAN) tablet 1 mg  1 mg Oral Q8H PRN Clayton Bibles, PA-C      . nicotine (NICODERM CQ - dosed in mg/24 hours) patch 21 mg  21 mg Transdermal Daily Emily West, PA-C   21 mg at 01/28/15 2130  . ondansetron (ZOFRAN) tablet 4 mg  4 mg Oral Q8H PRN Clayton Bibles, PA-C      . zolpidem (AMBIEN) tablet 5 mg  5 mg Oral QHS PRN Clayton Bibles, PA-C       Current Outpatient  Prescriptions  Medication Sig Dispense Refill  . amantadine (SYMMETREL) 100 MG capsule Take 100 mg by mouth daily.   0  . Cholecalciferol (VITAMIN D) 2000 UNITS tablet Take 2,000 Units by mouth daily.    Marland Kitchen FLUoxetine (PROZAC) 20 MG capsule Take 20 mg by mouth daily.   0  . gabapentin (NEURONTIN) 100 MG capsule Take 100 mg by mouth 2 (two) times daily.   0  . lisinopril-hydrochlorothiazide (PRINZIDE,ZESTORETIC) 10-12.5 MG per tablet Take 1 tablet by mouth daily.    Marland Kitchen LORazepam (ATIVAN) 1 MG tablet Take 1 mg by mouth 2 (two) times daily.  0  . pioglitazone (ACTOS) 30 MG tablet Take 30 mg by mouth daily.    . simvastatin (ZOCOR) 40 MG tablet Take 40 mg by mouth every evening.    . vitamin E 600 UNIT capsule Take 600 Units by mouth daily.    . ziprasidone (GEODON) 60 MG capsule Take 60 mg by mouth 2 (two) times daily with a meal.   0    Musculoskeletal: Strength & Muscle Tone: unable to assess; patient seen while in bed Gait & Station: unable to assess; patient seen while in bed Patient leans: unable to assess; patient seen while in bed  Psychiatric Specialty Exam: Physical Exam  Review of Systems  Constitutional: Negative.   HENT: Negative.   Eyes: Negative.   Respiratory: Negative.   Cardiovascular: Negative.   Gastrointestinal: Negative.   Genitourinary: Negative.   Musculoskeletal: Negative.   Skin:  Negative.   Psychiatric/Behavioral: Positive for hallucinations. The patient is nervous/anxious.     Blood pressure 144/66, pulse 81, temperature 98.6 F (37 C), temperature source Oral, resp. rate 20, SpO2 100 %.There is no height or weight on file to calculate BMI.  General Appearance: Fairly Groomed  Engineer, water::  Fair  Speech:  Garbled  Volume:  Decreased  Mood:  Depressed  Affect:  Flat  Thought Process:  Disorganized  Orientation:  Other:  Oriented to person  Thought Content:  Hallucinations: Auditory  Suicidal Thoughts:  No  Homicidal Thoughts:  No  Memory:   Immediate;   Fair Recent;   Fair Remote;   Fair  Judgement:  Fair  Insight:  Lacking  Psychomotor Activity:  Increased  Concentration:  Poor  Recall:  Poor  Fund of Knowledge:Fair  Language: Fair  Akathisia:  No  Handed:  Right  AIMS (if indicated):     Assets:  Desire for Improvement Housing Resilience  ADL's:  Intact  Cognition: WNL  Sleep:      Medical Decision Making: Review of Psycho-Social Stressors (1), Review or order clinical lab tests (1), Established Problem, Worsening (2), Review or order medicine tests (1) and Review of Medication Regimen & Side Effects (2)  Treatment Plan Summary: Daily contact with patient to assess and evaluate symptoms and progress in treatment, Medication management and Plan : Admit  Plan:  Recommend psychiatric Inpatient admission when medically cleared.  Disposition: Seek inpatient psychiatric placement at an available geropsych facility  Lurena Nida 01/30/2015 1:04 AM Patient seen face-to-face for psychiatric evaluation, chart reviewed and case discussed with the physician extender and developed treatment plan. Reviewed the information documented and agree with the treatment plan. Corena Pilgrim, MD

## 2015-02-18 DIAGNOSIS — F209 Schizophrenia, unspecified: Secondary | ICD-10-CM | POA: Diagnosis not present

## 2015-03-19 DIAGNOSIS — F209 Schizophrenia, unspecified: Secondary | ICD-10-CM | POA: Diagnosis not present

## 2015-04-16 DIAGNOSIS — F209 Schizophrenia, unspecified: Secondary | ICD-10-CM | POA: Diagnosis not present

## 2015-04-16 DIAGNOSIS — Z79899 Other long term (current) drug therapy: Secondary | ICD-10-CM | POA: Diagnosis not present

## 2015-05-07 DIAGNOSIS — F209 Schizophrenia, unspecified: Secondary | ICD-10-CM | POA: Diagnosis not present

## 2015-05-21 ENCOUNTER — Emergency Department (HOSPITAL_COMMUNITY): Payer: Medicare Other

## 2015-05-21 ENCOUNTER — Encounter (HOSPITAL_COMMUNITY): Payer: Self-pay | Admitting: Emergency Medicine

## 2015-05-21 ENCOUNTER — Emergency Department (HOSPITAL_COMMUNITY)
Admission: EM | Admit: 2015-05-21 | Discharge: 2015-05-22 | Payer: Medicare Other | Attending: Emergency Medicine | Admitting: Emergency Medicine

## 2015-05-21 DIAGNOSIS — F259 Schizoaffective disorder, unspecified: Secondary | ICD-10-CM | POA: Diagnosis not present

## 2015-05-21 DIAGNOSIS — Z79899 Other long term (current) drug therapy: Secondary | ICD-10-CM | POA: Insufficient documentation

## 2015-05-21 DIAGNOSIS — F329 Major depressive disorder, single episode, unspecified: Secondary | ICD-10-CM | POA: Insufficient documentation

## 2015-05-21 DIAGNOSIS — I10 Essential (primary) hypertension: Secondary | ICD-10-CM | POA: Insufficient documentation

## 2015-05-21 DIAGNOSIS — Z0289 Encounter for other administrative examinations: Secondary | ICD-10-CM | POA: Diagnosis not present

## 2015-05-21 DIAGNOSIS — Z01818 Encounter for other preprocedural examination: Secondary | ICD-10-CM | POA: Insufficient documentation

## 2015-05-21 DIAGNOSIS — Z0000 Encounter for general adult medical examination without abnormal findings: Secondary | ICD-10-CM | POA: Diagnosis not present

## 2015-05-21 DIAGNOSIS — E780 Pure hypercholesterolemia: Secondary | ICD-10-CM | POA: Insufficient documentation

## 2015-05-21 DIAGNOSIS — Z880 Allergy status to penicillin: Secondary | ICD-10-CM | POA: Insufficient documentation

## 2015-05-21 DIAGNOSIS — Z0189 Encounter for other specified special examinations: Secondary | ICD-10-CM | POA: Diagnosis not present

## 2015-05-21 DIAGNOSIS — E119 Type 2 diabetes mellitus without complications: Secondary | ICD-10-CM | POA: Diagnosis not present

## 2015-05-21 DIAGNOSIS — F919 Conduct disorder, unspecified: Secondary | ICD-10-CM | POA: Diagnosis present

## 2015-05-21 LAB — RAPID URINE DRUG SCREEN, HOSP PERFORMED
Amphetamines: NOT DETECTED
Barbiturates: NOT DETECTED
Benzodiazepines: NOT DETECTED
Cocaine: NOT DETECTED
Opiates: NOT DETECTED
Tetrahydrocannabinol: NOT DETECTED

## 2015-05-21 LAB — SALICYLATE LEVEL: Salicylate Lvl: <4 mg/dL (ref 2.8–30.0)

## 2015-05-21 LAB — COMPREHENSIVE METABOLIC PANEL
ALT: 11 U/L — AB (ref 14–54)
AST: 18 U/L (ref 15–41)
Albumin: 3.6 g/dL (ref 3.5–5.0)
Alkaline Phosphatase: 34 U/L — ABNORMAL LOW (ref 38–126)
Anion gap: 6 (ref 5–15)
BILIRUBIN TOTAL: 0.6 mg/dL (ref 0.3–1.2)
BUN: 17 mg/dL (ref 6–20)
CO2: 28 mmol/L (ref 22–32)
Calcium: 9.3 mg/dL (ref 8.9–10.3)
Chloride: 108 mmol/L (ref 101–111)
Creatinine, Ser: 0.82 mg/dL (ref 0.44–1.00)
GFR calc Af Amer: >60 mL/min (ref >60)
Glucose, Bld: 102 mg/dL — ABNORMAL HIGH (ref 65–99)
Potassium: 3.9 mmol/L (ref 3.5–5.1)
Sodium: 142 mmol/L (ref 135–145)
TOTAL PROTEIN: 6.9 g/dL (ref 6.5–8.1)

## 2015-05-21 LAB — URINALYSIS, ROUTINE W REFLEX MICROSCOPIC
Bilirubin Urine: NEGATIVE
Glucose, UA: NEGATIVE mg/dL
Hgb urine dipstick: NEGATIVE
Ketones, ur: NEGATIVE mg/dL
NITRITE: NEGATIVE
PH: 7 (ref 5.0–8.0)
Protein, ur: NEGATIVE mg/dL
SPECIFIC GRAVITY, URINE: 1.008 (ref 1.005–1.030)
UROBILINOGEN UA: 0.2 mg/dL (ref 0.0–1.0)

## 2015-05-21 LAB — CBC
HCT: 36.1 % (ref 36.0–46.0)
Hemoglobin: 11.8 g/dL — ABNORMAL LOW (ref 12.0–15.0)
MCH: 29.6 pg (ref 26.0–34.0)
MCHC: 32.7 g/dL (ref 30.0–36.0)
MCV: 90.5 fL (ref 78.0–100.0)
PLATELETS: 208 10*3/uL (ref 150–400)
RBC: 3.99 MIL/uL (ref 3.87–5.11)
RDW: 15.5 % (ref 11.5–15.5)
WBC: 3.6 10*3/uL — ABNORMAL LOW (ref 4.0–10.5)

## 2015-05-21 LAB — URINE MICROSCOPIC-ADD ON

## 2015-05-21 LAB — ETHANOL

## 2015-05-21 LAB — ACETAMINOPHEN LEVEL: Acetaminophen (Tylenol), Serum: <10 ug/mL — ABNORMAL LOW (ref 10–30)

## 2015-05-21 MED ORDER — TRAZODONE HCL 50 MG PO TABS: 50.0000 mg | ORAL_TABLET | Freq: Every evening | ORAL | Status: DC | PRN

## 2015-05-21 MED ORDER — ONDANSETRON HCL 4 MG PO TABS: 4.0000 mg | ORAL_TABLET | Freq: Three times a day (TID) | ORAL | Status: DC | PRN

## 2015-05-21 MED ORDER — NICOTINE 21 MG/24HR TD PT24
21.0000 mg | MEDICATED_PATCH | Freq: Every day | TRANSDERMAL | Status: DC | PRN
Start: 2015-05-21 — End: 2015-05-22

## 2015-05-21 MED ORDER — PIOGLITAZONE HCL 30 MG PO TABS
30.0000 mg | ORAL_TABLET | Freq: Every day | ORAL | Status: DC
  Administered 2015-05-21 – 2015-05-22 (×2): 30 mg via ORAL
  Filled 2015-05-21 (×2): qty 1

## 2015-05-21 MED ORDER — LISINOPRIL-HYDROCHLOROTHIAZIDE 10-12.5 MG PO TABS: 1.0000 | ORAL_TABLET | Freq: Every day | ORAL | Status: DC

## 2015-05-21 MED ORDER — HYDROCHLOROTHIAZIDE 12.5 MG PO CAPS
12.5000 mg | ORAL_CAPSULE | Freq: Every day | ORAL | Status: DC
  Administered 2015-05-21: 12.5 mg via ORAL
  Filled 2015-05-21 (×2): qty 1

## 2015-05-21 MED ORDER — ZOLPIDEM TARTRATE 5 MG PO TABS: 5.0000 mg | ORAL_TABLET | Freq: Every evening | ORAL | Status: DC | PRN

## 2015-05-21 MED ORDER — ZIPRASIDONE HCL 20 MG PO CAPS
60.0000 mg | ORAL_CAPSULE | Freq: Two times a day (BID) | ORAL | Status: DC
  Administered 2015-05-21 – 2015-05-22 (×3): 60 mg via ORAL
  Filled 2015-05-21 (×3): qty 3

## 2015-05-21 MED ORDER — AMANTADINE HCL 100 MG PO CAPS
100.0000 mg | ORAL_CAPSULE | Freq: Every day | ORAL | Status: DC
  Administered 2015-05-21: 100 mg via ORAL
  Filled 2015-05-21: qty 1

## 2015-05-21 MED ORDER — GABAPENTIN 100 MG PO CAPS
100.0000 mg | ORAL_CAPSULE | Freq: Two times a day (BID) | ORAL | Status: DC
  Administered 2015-05-21 – 2015-05-22 (×3): 100 mg via ORAL
  Filled 2015-05-21 (×3): qty 1

## 2015-05-21 MED ORDER — ACETAMINOPHEN 325 MG PO TABS: 650.0000 mg | ORAL_TABLET | ORAL | Status: DC | PRN

## 2015-05-21 MED ORDER — LISINOPRIL 10 MG PO TABS
10.0000 mg | ORAL_TABLET | Freq: Every day | ORAL | Status: DC
  Administered 2015-05-21 – 2015-05-22 (×2): 10 mg via ORAL
  Filled 2015-05-21 (×2): qty 1

## 2015-05-21 MED ORDER — ALUM & MAG HYDROXIDE-SIMETH 200-200-20 MG/5ML PO SUSP: 30.0000 mL | ORAL | Status: DC | PRN

## 2015-05-21 MED ORDER — AMANTADINE HCL 100 MG PO CAPS
100.0000 mg | ORAL_CAPSULE | Freq: Two times a day (BID) | ORAL | Status: DC
  Administered 2015-05-21 – 2015-05-22 (×2): 100 mg via ORAL
  Filled 2015-05-21 (×4): qty 1

## 2015-05-21 MED ORDER — FLUOXETINE HCL 20 MG PO CAPS
20.0000 mg | ORAL_CAPSULE | Freq: Every day | ORAL | Status: DC
  Administered 2015-05-21 – 2015-05-22 (×2): 20 mg via ORAL
  Filled 2015-05-21 (×2): qty 1

## 2015-05-21 MED ORDER — IBUPROFEN 200 MG PO TABS: 600.0000 mg | ORAL_TABLET | Freq: Three times a day (TID) | ORAL | Status: DC | PRN
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
## 2015-05-21 NOTE — ED Notes (Signed)
Niece bedside visiting pt at this time
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
## 2015-05-21 NOTE — ED Notes (Signed)
NP and psychiatrist at bedside and plans to for group home placement.

## 2015-05-21 NOTE — ED Notes (Signed)
Niece Jimmye Norman requesting to be contact once pt has bed placement.  Phone number as provided [phone].

## 2015-05-21 NOTE — ED Notes (Addendum)
IVC paperwork at nurse station. Pt refuses to participate in assessment. She states that she does not need anything and that she is not in pain but would not answer any other questions. Pt's legal caregiver is Jimmye Norman and she would like to be called with updates to the pt's plan of care  [phone]

## 2015-05-21 NOTE — ED Notes (Signed)
Resting quietly with eye closed. Easily arousable. Verbally responsive. Resp even and unlabored. ABC's intact. No behavior problems noted. NAD noted. Sitter at bedside.  

## 2015-05-21 NOTE — ED Notes (Signed)
Resting quietly with eye closed. Easily arousable. Verbally responsive. Resp even and unlabored. ABC's intact. No behavior problems noted. Pt denies SI and hallucinations. NAD noted. Sitter at bedside.
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
## 2015-05-21 NOTE — ED Notes (Addendum)
Pt refuse her vitals, RN notified

## 2015-05-21 NOTE — ED Notes (Signed)
Lights dimmed per patient request.

## 2015-05-21 NOTE — ED Notes (Signed)
Awake.  Verbally responsive. Resp even and unlabored. ABC's intact. No behavior problems noted. NAD noted. Sitter at bedside. Pt eated 100% of lunch.

## 2015-05-21 NOTE — BHH Counselor (Signed)
Referral packet faxed successfully sent on 05/21/2015 at 11:15 pm to [facility], [facility], and [facility] K. Tiburcio Pea, MS  Counselor 05/21/2015 11:35 PM

## 2015-05-22 DIAGNOSIS — F200 Paranoid schizophrenia: Secondary | ICD-10-CM | POA: Diagnosis not present

## 2015-05-22 DIAGNOSIS — E119 Type 2 diabetes mellitus without complications: Secondary | ICD-10-CM | POA: Diagnosis present

## 2015-05-22 DIAGNOSIS — E785 Hyperlipidemia, unspecified: Secondary | ICD-10-CM | POA: Diagnosis not present

## 2015-05-22 DIAGNOSIS — E780 Pure hypercholesterolemia: Secondary | ICD-10-CM | POA: Diagnosis not present

## 2015-05-22 DIAGNOSIS — Z79899 Other long term (current) drug therapy: Secondary | ICD-10-CM | POA: Diagnosis not present

## 2015-05-22 DIAGNOSIS — Z0289 Encounter for other administrative examinations: Secondary | ICD-10-CM | POA: Diagnosis not present

## 2015-05-22 DIAGNOSIS — I10 Essential (primary) hypertension: Secondary | ICD-10-CM | POA: Diagnosis not present

## 2015-05-22 DIAGNOSIS — F329 Major depressive disorder, single episode, unspecified: Secondary | ICD-10-CM | POA: Diagnosis not present

## 2015-05-22 DIAGNOSIS — N390 Urinary tract infection, site not specified: Secondary | ICD-10-CM | POA: Diagnosis not present

## 2015-05-22 NOTE — BHH Counselor (Addendum)
BHH Assessment Progress Note  Per information rec'd at shift report, pt has been accepted to [facility] by Dr. Belva Crome to [facility] geriatric unit. Call report to [phone] after 9am. Counselor provided information to pt's nurse, Samara Deist.   Johny Shock. Ladona Ridgel, MS, NCC, LPCA Counselor     [phone] Counselor called pt's niece, Jimmye Norman [phone]), and advised her of pt's acceptance to [facility].

## 2015-05-22 NOTE — Progress Notes (Signed)
Full report given to [facility] [employer]. VSS and pt ready for transport via Sheriff's office.

## 2015-05-22 NOTE — Progress Notes (Signed)
Pt accepted to [facility] by Dr. Belva Crome. Pt to be transported by sheriff under IVC. RN can call report to [phone].   Olga Coaster, LCSW  Clinical Social Work  [employer] [phone]

## 2015-08-20 DIAGNOSIS — F209 Schizophrenia, unspecified: Secondary | ICD-10-CM | POA: Diagnosis not present

## 2015-10-18 DIAGNOSIS — E119 Type 2 diabetes mellitus without complications: Secondary | ICD-10-CM | POA: Diagnosis not present

## 2015-10-18 DIAGNOSIS — F209 Schizophrenia, unspecified: Secondary | ICD-10-CM | POA: Diagnosis not present

## 2015-10-18 DIAGNOSIS — E782 Mixed hyperlipidemia: Secondary | ICD-10-CM | POA: Diagnosis not present

## 2015-10-18 DIAGNOSIS — Z79899 Other long term (current) drug therapy: Secondary | ICD-10-CM | POA: Diagnosis not present

## 2015-12-20 DIAGNOSIS — F209 Schizophrenia, unspecified: Secondary | ICD-10-CM | POA: Diagnosis not present

## 2015-12-24 DIAGNOSIS — F209 Schizophrenia, unspecified: Secondary | ICD-10-CM | POA: Diagnosis not present

## 2016-01-21 DIAGNOSIS — F209 Schizophrenia, unspecified: Secondary | ICD-10-CM | POA: Diagnosis not present

## 2016-02-18 DIAGNOSIS — F209 Schizophrenia, unspecified: Secondary | ICD-10-CM | POA: Diagnosis not present

## 2016-03-10 DIAGNOSIS — F209 Schizophrenia, unspecified: Secondary | ICD-10-CM | POA: Diagnosis not present

## 2016-03-18 DIAGNOSIS — F209 Schizophrenia, unspecified: Secondary | ICD-10-CM | POA: Diagnosis not present

## 2016-04-09 ENCOUNTER — Encounter (HOSPITAL_COMMUNITY): Payer: Self-pay | Admitting: Emergency Medicine

## 2016-04-09 ENCOUNTER — Emergency Department (HOSPITAL_COMMUNITY)
Admission: EM | Admit: 2016-04-09 | Discharge: 2016-04-12 | Disposition: A | Discharge status: Home / Self Care | Payer: Medicare Other | Attending: Physician Assistant | Admitting: Physician Assistant

## 2016-04-09 DIAGNOSIS — E119 Type 2 diabetes mellitus without complications: Secondary | ICD-10-CM | POA: Diagnosis not present

## 2016-04-09 DIAGNOSIS — I10 Essential (primary) hypertension: Secondary | ICD-10-CM | POA: Diagnosis not present

## 2016-04-09 DIAGNOSIS — Z01818 Encounter for other preprocedural examination: Secondary | ICD-10-CM | POA: Diagnosis not present

## 2016-04-09 DIAGNOSIS — F0391 Unspecified dementia with behavioral disturbance: Secondary | ICD-10-CM

## 2016-04-09 DIAGNOSIS — Z79899 Other long term (current) drug therapy: Secondary | ICD-10-CM | POA: Insufficient documentation

## 2016-04-09 DIAGNOSIS — F259 Schizoaffective disorder, unspecified: Secondary | ICD-10-CM | POA: Diagnosis not present

## 2016-04-09 DIAGNOSIS — F03918 Unspecified dementia, unspecified severity, with other behavioral disturbance: Secondary | ICD-10-CM | POA: Diagnosis present

## 2016-04-09 DIAGNOSIS — Z049 Encounter for examination and observation for unspecified reason: Secondary | ICD-10-CM

## 2016-04-09 DIAGNOSIS — F251 Schizoaffective disorder, depressive type: Secondary | ICD-10-CM | POA: Diagnosis not present

## 2016-04-09 DIAGNOSIS — R440 Auditory hallucinations: Secondary | ICD-10-CM | POA: Diagnosis present

## 2016-04-09 LAB — COMPREHENSIVE METABOLIC PANEL
ALBUMIN: 3.9 g/dL (ref 3.5–5.0)
ALT: 11 U/L — ABNORMAL LOW (ref 14–54)
ANION GAP: 6 (ref 5–15)
AST: 17 U/L (ref 15–41)
Alkaline Phosphatase: 43 U/L (ref 38–126)
BUN: 19 mg/dL (ref 6–20)
CALCIUM: 9.8 mg/dL (ref 8.9–10.3)
CO2: 28 mmol/L (ref 22–32)
Chloride: 106 mmol/L (ref 101–111)
Creatinine, Ser: 0.7 mg/dL (ref 0.44–1.00)
GFR calc non Af Amer: >60 mL/min (ref >60)
Glucose, Bld: 124 mg/dL — ABNORMAL HIGH (ref 65–99)
POTASSIUM: 3.7 mmol/L (ref 3.5–5.1)
SODIUM: 140 mmol/L (ref 135–145)
TOTAL PROTEIN: 7.8 g/dL (ref 6.5–8.1)
Total Bilirubin: 0.5 mg/dL (ref 0.3–1.2)

## 2016-04-09 LAB — CBC
HCT: 40.9 % (ref 36.0–46.0)
Hemoglobin: 13.2 g/dL (ref 12.0–15.0)
MCH: 29.6 pg (ref 26.0–34.0)
MCHC: 32.3 g/dL (ref 30.0–36.0)
MCV: 91.7 fL (ref 78.0–100.0)
Platelets: 239 10*3/uL (ref 150–400)
RBC: 4.46 MIL/uL (ref 3.87–5.11)
RDW: 15.8 % — AB (ref 11.5–15.5)
WBC: 4.4 10*3/uL (ref 4.0–10.5)

## 2016-04-09 LAB — ACETAMINOPHEN LEVEL

## 2016-04-09 LAB — SALICYLATE LEVEL: Salicylate Lvl: <4 mg/dL (ref 2.8–30.0)

## 2016-04-09 LAB — ETHANOL: Alcohol, Ethyl (B): <5 mg/dL (ref <5)

## 2016-04-09 LAB — CBG MONITORING, ED: GLUCOSE-CAPILLARY: 97 mg/dL (ref 65–99)

## 2016-04-09 MED ORDER — INSULIN ASPART 100 UNIT/ML ~~LOC~~ SOLN: 0.0000 [IU] | Freq: Every day | SUBCUTANEOUS | Status: DC

## 2016-04-09 MED ORDER — INSULIN ASPART 100 UNIT/ML ~~LOC~~ SOLN: 0.0000 [IU] | Freq: Three times a day (TID) | SUBCUTANEOUS | Status: DC

## 2016-04-09 MED ORDER — ONDANSETRON HCL 4 MG PO TABS: 4.0000 mg | ORAL_TABLET | Freq: Three times a day (TID) | ORAL | Status: DC | PRN

## 2016-04-09 MED ORDER — ALUM & MAG HYDROXIDE-SIMETH 200-200-20 MG/5ML PO SUSP: 30.0000 mL | ORAL | Status: DC | PRN

## 2016-04-09 MED ORDER — ACETAMINOPHEN 325 MG PO TABS: 650.0000 mg | ORAL_TABLET | ORAL | Status: DC | PRN

## 2016-04-09 MED ORDER — BENZTROPINE MESYLATE 1 MG PO TABS
1.0000 mg | ORAL_TABLET | Freq: Every day | ORAL | Status: DC
  Administered 2016-04-09 – 2016-04-10 (×2): 1 mg via ORAL
  Filled 2016-04-09 (×2): qty 1

## 2016-04-09 MED ORDER — DIVALPROEX SODIUM ER 500 MG PO TB24
500.0000 mg | ORAL_TABLET | Freq: Every day | ORAL | Status: DC
  Administered 2016-04-09 – 2016-04-10 (×2): 500 mg via ORAL
  Filled 2016-04-09 (×2): qty 1

## 2016-04-09 MED ORDER — HALOPERIDOL DECANOATE 100 MG/ML IM SOLN
150.0000 mg | INTRAMUSCULAR | Status: DC
  Filled 2016-04-09: qty 1.5

## 2016-04-09 MED ORDER — OLANZAPINE 5 MG PO TABS
5.0000 mg | ORAL_TABLET | Freq: Every day | ORAL | Status: DC
  Administered 2016-04-09 – 2016-04-10 (×2): 5 mg via ORAL
  Filled 2016-04-09 (×2): qty 1
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
## 2016-04-09 NOTE — ED Notes (Signed)
Pt's belongings are in locker 33.   Joan Nash

## 2016-04-09 NOTE — ED Triage Notes (Signed)
Spoke with pharmacy regarding Haldol order, last given date March 15, 2016. Not due till April 12, 2016. Follow up call to Jaynie Crumble, PA-C to clarify who requested to discontinue the order.

## 2016-04-09 NOTE — ED Provider Notes (Signed)
[facility] DEPT Provider Note   CSN: [id number] Arrival date & time: 04/09/16  1556  First Provider Contact:  None       History   Chief Complaint Chief Complaint  Patient presents with  . Medical Clearance    IVC    HPI Joan Nash is a 70 y.o. female.  Joan Nash is a 70 y.o. Female with hx of DM, HTN, depression, schizoaffective disorder, presents to ED with complaint of hallucinations, insomnia, aggression. Pt brought here by her niece. Pt lives with niece, who brought pt here. Pt apparently with aggressive behavior at home, not sleeping, yelling, cursing, talking to objects that are not there. Pt with similar admissions in the past. Pt is taking her medications. Pt denies any pain or symptoms.        Past Medical History:  Diagnosis Date  . Depression   . Diabetes mellitus   . Hypercholesterolemia   . Hypertension     Patient Active Problem List   Diagnosis Date Noted  . Encounter for preadmission testing   . Schizoaffective disorder (HCC) 01/29/2015    Past Surgical History:  Procedure Laterality Date  . ABDOMINAL HYSTERECTOMY    . TOOTH EXTRACTION      OB History    No data available       Home Medications    Prior to Admission medications   Medication Sig Start Date End Date Taking? Authorizing Provider  benztropine (COGENTIN) 1 MG tablet Take 1 mg by mouth daily.   Yes Historical Provider, MD  divalproex (DEPAKOTE ER) 500 MG 24 hr tablet Take 500 mg by mouth daily.   Yes Historical Provider, MD  haloperidol decanoate (HALDOL DECANOATE) 50 MG/ML injection Inject 150 mg into the muscle every 28 (twenty-eight) days.   Yes Historical Provider, MD  OLANZapine (ZYPREXA) 5 MG tablet Take 5 mg by mouth daily.   Yes Historical Provider, MD    Family History History reviewed. No pertinent family history.  Social History Social History  Substance Use Topics  . Smoking status: Never Smoker  . Smokeless tobacco: Not on file  . Alcohol use  No     Allergies   Penicillins   Review of Systems Review of Systems  Unable to perform ROS: Mental status change     Physical Exam Updated Vital Signs BP 149/74 (BP Location: Right Arm)   Pulse 97   Temp 98.5 F (36.9 C) (Oral)   Resp 17   SpO2 98%   Physical Exam  Constitutional: She appears well-developed and well-nourished. No distress.  HENT:  Head: Normocephalic.  Eyes: Conjunctivae are normal.  Neck: Neck supple.  Cardiovascular: Normal rate, regular rhythm and normal heart sounds.   Pulmonary/Chest: Effort normal and breath sounds normal. No respiratory distress. She has no wheezes. She has no rales.  Abdominal: Soft. Bowel sounds are normal. She exhibits no distension. There is no tenderness. There is no rebound.  Musculoskeletal: She exhibits no edema.  Neurological: She is alert.  Skin: Skin is warm and dry.  Psychiatric: Her affect is blunt. Her speech is delayed. She is withdrawn and actively hallucinating.  Nursing note and vitals reviewed.    ED Treatments / Results  Labs (all labs ordered are listed, but only abnormal results are displayed) Labs Reviewed  COMPREHENSIVE METABOLIC PANEL - Abnormal; Notable for the following:       Result Value   Glucose, Bld 124 (*)    ALT 11 (*)    All other components within  normal limits  ACETAMINOPHEN LEVEL - Abnormal; Notable for the following:    Acetaminophen (Tylenol), Serum <10 (*)    All other components within normal limits  CBC - Abnormal; Notable for the following:    RDW 15.8 (*)    All other components within normal limits  ETHANOL  SALICYLATE LEVEL  URINE RAPID DRUG SCREEN, HOSP PERFORMED  URINALYSIS, ROUTINE W REFLEX MICROSCOPIC (NOT AT [facility])    EKG  EKG Interpretation None       Radiology No results found.  Procedures Procedures (including critical care time)  Medications Ordered in ED Medications - No data to display   Initial Impression / Assessment and Plan / ED Course    I have reviewed the triage vital signs and the nursing notes.  Pertinent labs & imaging results that were available during my care of the patient were reviewed by me and considered in my medical decision making (see chart for details).  Clinical Course    Patient in emergency department with hallucinations, insomnia, aggressive behavior at home. Labs and TTS consult pending.  9:53 PM I suspect TTS, will observe overnight and reassess in the morning. Urinalysis pending. Signed out to [employer] [facility] pending UA.   Final Clinical Impressions(s) / ED Diagnoses   Final diagnoses:  None    New Prescriptions New Prescriptions   No medications on file     Jaynie Crumble, PA-C 04/09/16 2154    Courteney Lyn Mackuen, MD 04/09/16 2328

## 2016-04-09 NOTE — ED Notes (Signed)
Bed: PX10 Expected date:  Expected time:  Means of arrival:  Comments: Hold for Triage 5
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
## 2016-04-10 ENCOUNTER — Emergency Department (HOSPITAL_COMMUNITY): Payer: Medicare Other

## 2016-04-10 DIAGNOSIS — F251 Schizoaffective disorder, depressive type: Secondary | ICD-10-CM | POA: Diagnosis not present

## 2016-04-10 DIAGNOSIS — Z01818 Encounter for other preprocedural examination: Secondary | ICD-10-CM | POA: Diagnosis not present

## 2016-04-10 LAB — URINALYSIS, ROUTINE W REFLEX MICROSCOPIC
Bilirubin Urine: NEGATIVE
GLUCOSE, UA: NEGATIVE mg/dL
HGB URINE DIPSTICK: NEGATIVE
Ketones, ur: NEGATIVE mg/dL
Nitrite: NEGATIVE
PH: 7 (ref 5.0–8.0)
PROTEIN: NEGATIVE mg/dL
Specific Gravity, Urine: 1.014 (ref 1.005–1.030)

## 2016-04-10 LAB — URINE MICROSCOPIC-ADD ON: RBC / HPF: NONE SEEN RBC/hpf (ref 0–5)

## 2016-04-10 LAB — RAPID URINE DRUG SCREEN, HOSP PERFORMED
Amphetamines: NOT DETECTED
BARBITURATES: NOT DETECTED
Benzodiazepines: NOT DETECTED
COCAINE: NOT DETECTED
Opiates: NOT DETECTED
TETRAHYDROCANNABINOL: NOT DETECTED

## 2016-04-10 LAB — CBG MONITORING, ED
GLUCOSE-CAPILLARY: 109 mg/dL — AB (ref 65–99)
Glucose-Capillary: 88 mg/dL (ref 65–99)
Glucose-Capillary: 90 mg/dL (ref 65–99)

## 2016-04-10 MED ORDER — QUETIAPINE FUMARATE 50 MG PO TABS
50.0000 mg | ORAL_TABLET | Freq: Every day | ORAL | Status: DC
  Administered 2016-04-10 – 2016-04-11 (×2): 50 mg via ORAL
  Filled 2016-04-10 (×2): qty 1

## 2016-04-10 MED ORDER — BENZTROPINE MESYLATE 1 MG PO TABS: 1.0000 mg | ORAL_TABLET | Freq: Two times a day (BID) | ORAL | Status: DC

## 2016-04-10 MED ORDER — CIPROFLOXACIN HCL 500 MG PO TABS
500.0000 mg | ORAL_TABLET | Freq: Two times a day (BID) | ORAL | Status: DC
  Administered 2016-04-10 – 2016-04-12 (×5): 500 mg via ORAL
  Filled 2016-04-10 (×5): qty 1

## 2016-04-10 MED ORDER — AMANTADINE HCL 100 MG PO CAPS
100.0000 mg | ORAL_CAPSULE | Freq: Two times a day (BID) | ORAL | Status: DC
  Administered 2016-04-10 – 2016-04-12 (×4): 100 mg via ORAL
  Filled 2016-04-10 (×4): qty 1

## 2016-04-10 MED ORDER — DIVALPROEX SODIUM ER 500 MG PO TB24
500.0000 mg | ORAL_TABLET | Freq: Two times a day (BID) | ORAL | Status: DC
  Administered 2016-04-10 – 2016-04-12 (×5): 500 mg via ORAL
  Filled 2016-04-10 (×5): qty 1

## 2016-04-10 NOTE — Progress Notes (Addendum)
Pt is awake and stated she slept good. Pt has a sitter and appears comfortable at this time.(8:10am )pt FSBS 88 this am. No insulin coverage given. Pt is eating breakfast. (8:30am )Pt is alert to person and place. She is unclear of the year. (8:55am ) (10am )Spoke to NP concerning pts swollen tongue. Per NP pt has Tardive dystconesia. Pulse ox on Room air is 92. Pt does not appear in respiratory distress. Pt took a shower and tolerated well. Encouraged to push fluids. (10:10am )Pt s/p a CXR in the dept. Tolerated well. 11:50am -EKG done -Urine obtained and sent to the lab. Left a voicemail for Tom. (1:20pm) Pts guardian phoned and would like to fax papers to show she is the guardian. Her contact no is: [phone]. Her name : Karyl Kinnier. Message left for Glenda to fax the information to : [phone]. Received a call form Kim, Sports coach, requesting the pt be given Glenda's number and have the pt contact her. Informed Kim the pt did not want to talk to [facility] and did not want the writer to give Rivka Barbara any information about her. Dialed the number for the pt and handed her the phone.(1:40pm)Pt did speak with Glenda for a short time. Glenda came to visit the pt. (3:20pm) pt asked her to leave. Report to oncoming. Shift. (4pm)

## 2016-04-10 NOTE — ED Provider Notes (Signed)
3:58 AM Patient's UA reviewed. No UTI. Patient medically cleared. Psych to re-eval in the AM.    Antony Madura, PA-C 04/10/16 0359    Cy Blamer, MD 04/10/16 5929

## 2016-04-10 NOTE — Progress Notes (Signed)
ED CM received a call from Carmichaels at the main CM dept office after pt's POA called The main CM office because she states she was not given information about the pt from [facility] RN  POA number is [phone] spoke Henrene Pastor x [phone] who states Glenda did call, Rn asked Rivka Barbara to fax in [place] papers and the pt is stating she did not want to speak with [facility] or want Rn to give information to [facility] at the time of the call Cm gave Zollie Pee information and inquired if RN could assist pt to call Glenda to tell her that she prefers not to speak with her and for her not to call requesting update on pt. RN can document interaction

## 2016-04-10 NOTE — ED Provider Notes (Signed)
On review of labs, patient has large leukocytes and 6-30 WBC on microscopic exam. Will treat as possible UTI (unable to get history based on psych disease) and send for culture. Will give cipro given anaphylactic PCN reaction   Pricilla Loveless, MD 04/10/16 [phone]

## 2016-04-10 NOTE — BH Assessment (Signed)
BHH Assessment Progress Note  Per Thedore Mins, MD, this pt requires psychiatric hospitalization at this time.  The following facilities have been contacted to seek placement for this pt, with results as noted:  Beds available, information sent, decision pending:  [facility]   At capacity:  [facility]  Doylene Canning, [place] Triage Specialist [phone]

## 2016-04-10 NOTE — Consult Note (Signed)
[facility] Psychiatry Consult   Reason for Consult: aggressive and bizarre behavior,  Referring Physician:  EDP Patient Identification: Joan Nash MRN:  [id number] Principal Diagnosis: Schizoaffective disorder, depressive type [facility]) Diagnosis:   Patient Active Problem List   Diagnosis Date Noted  . Schizoaffective disorder, depressive type ([place]) [F25.1] 01/29/2015    Priority: High  . Encounter for preadmission testing [Z01.818]     Total Time spent with patient: 45 minutes  Subjective:   Joan Nash is a 70 y.o. female patient admitted with agitation and aggression.  HPI:  Joan Nash is a 70 y.o. female with long history of Schizophrenia and depression who was IVC'd by her niece, pt's POA and person with whom she lives with. Patient is a poor historian, IVC indicates that patient had a medication  change a month ago and the current medications are not working. Patient has become belligerent, aggressive, yells profanity at imaginary people and has become more forgetful. Patient reportedly left a pot burning on the stove twice. Patient denies alcohol or drugs use.   Past Psychiatric History: as above  Risk to Self: Suicidal Ideation: No Suicidal Intent: No Is patient at risk for suicide?: No Suicidal Plan?: No Access to Means: No What has been your use of drugs/alcohol within the last 12 months?: none Intentional Self Injurious Behavior: None Risk to Others: Homicidal Ideation: No Thoughts of Harm to Others: No Current Homicidal Intent: No Current Homicidal Plan: No Access to Homicidal Means: No History of harm to others?: No Assessment of Violence: None Noted Does patient have access to weapons?: No Criminal Charges Pending?: No Does patient have a court date: No Prior Inpatient Therapy: Prior Inpatient Therapy: Yes Prior Therapy Dates: multiple times-last in 2016 Prior Therapy Facilty/Provider(s): [facility] Reason for Treatment: Schizoaffective Prior  Outpatient Therapy: Prior Outpatient Therapy: No Does patient have an ACCT team?: No Does patient have Intensive In-House Services?  : No Does patient have [facility] services? : No Does patient have P4CC services?: No  Past Medical History:  Past Medical History:  Diagnosis Date  . Depression   . Diabetes mellitus   . Hypercholesterolemia   . Hypertension     Past Surgical History:  Procedure Laterality Date  . ABDOMINAL HYSTERECTOMY    . TOOTH EXTRACTION     Family History: History reviewed. No pertinent family history. Family Psychiatric  History: Social History:  History  Alcohol Use No     History  Drug Use No    Social History   Social History  . Marital status: Single    Spouse name: N/A  . Number of children: N/A  . Years of education: N/A   Social History Main Topics  . Smoking status: Never Smoker  . Smokeless tobacco: None  . Alcohol use No  . Drug use: No  . Sexual activity: Not Asked   Other Topics Concern  . None   Social History Narrative  . None   Additional Social History:    Allergies:   Allergies  Allergen Reactions  . Penicillins Anaphylaxis    Has patient had a PCN reaction causing immediate rash, facial/tongue/throat swelling, SOB or lightheadedness with hypotension: yes Has patient had a PCN reaction causing severe rash involving mucus membranes or skin necrosis: no Has patient had a PCN reaction that required hospitalization yes Has patient had a PCN reaction occurring within the last 10 years: no If all of the above answers are "NO", then may proceed with Cephalosporin use.  Labs:  Results for orders placed or performed during the hospital encounter of 04/09/16 (from the past 48 hour(s))  Comprehensive metabolic panel     Status: Abnormal   Collection Time: 04/09/16  4:52 PM  Result Value Ref Range   Sodium 140 135 - 145 mmol/L   Potassium 3.7 3.5 - 5.1 mmol/L   Chloride 106 101 - 111 mmol/L   CO2 28 22 - 32 mmol/L    Glucose, Bld 124 (H) 65 - 99 mg/dL   BUN 19 6 - 20 mg/dL   Creatinine, Ser 0.70 0.44 - 1.00 mg/dL   Calcium 9.8 8.9 - 10.3 mg/dL   Total Protein 7.8 6.5 - 8.1 g/dL   Albumin 3.9 3.5 - 5.0 g/dL   AST 17 15 - 41 U/L   ALT 11 (L) 14 - 54 U/L   Alkaline Phosphatase 43 38 - 126 U/L   Total Bilirubin 0.5 0.3 - 1.2 mg/dL   GFR calc non Af Amer >60 >60 mL/min   GFR calc Af Amer >60 >60 mL/min    Comment: (NOTE) The eGFR has been calculated using the CKD EPI equation. This calculation has not been validated in all clinical situations. eGFR's persistently <60 mL/min signify possible Chronic Kidney Disease.    Anion gap 6 5 - 15  Ethanol     Status: None   Collection Time: 04/09/16  4:52 PM  Result Value Ref Range   Alcohol, Ethyl (B) <5 <5 mg/dL    Comment:        LOWEST DETECTABLE LIMIT FOR SERUM ALCOHOL IS 5 mg/dL FOR MEDICAL PURPOSES ONLY   Salicylate level     Status: None   Collection Time: 04/09/16  4:52 PM  Result Value Ref Range   Salicylate Lvl <0.9 2.8 - 30.0 mg/dL  Acetaminophen level     Status: Abnormal   Collection Time: 04/09/16  4:52 PM  Result Value Ref Range   Acetaminophen (Tylenol), Serum <10 (L) 10 - 30 ug/mL    Comment:        THERAPEUTIC CONCENTRATIONS VARY SIGNIFICANTLY. A RANGE OF 10-30 ug/mL MAY BE AN EFFECTIVE CONCENTRATION FOR MANY PATIENTS. HOWEVER, SOME ARE BEST TREATED AT CONCENTRATIONS OUTSIDE THIS RANGE. ACETAMINOPHEN CONCENTRATIONS >150 ug/mL AT 4 HOURS AFTER INGESTION AND >50 ug/mL AT 12 HOURS AFTER INGESTION ARE OFTEN ASSOCIATED WITH TOXIC REACTIONS.   cbc     Status: Abnormal   Collection Time: 04/09/16  4:52 PM  Result Value Ref Range   WBC 4.4 4.0 - 10.5 K/uL   RBC 4.46 3.87 - 5.11 MIL/uL   Hemoglobin 13.2 12.0 - 15.0 g/dL   HCT 40.9 36.0 - 46.0 %   MCV 91.7 78.0 - 100.0 fL   MCH 29.6 26.0 - 34.0 pg   MCHC 32.3 30.0 - 36.0 g/dL   RDW 15.8 (H) 11.5 - 15.5 %   Platelets 239 150 - 400 K/uL  CBG monitoring, ED     Status: None    Collection Time: 04/09/16 10:27 PM  Result Value Ref Range   Glucose-Capillary 97 65 - 99 mg/dL  Rapid urine drug screen (hospital performed)     Status: None   Collection Time: 04/10/16 12:58 AM  Result Value Ref Range   Opiates NONE DETECTED NONE DETECTED   Cocaine NONE DETECTED NONE DETECTED   Benzodiazepines NONE DETECTED NONE DETECTED   Amphetamines NONE DETECTED NONE DETECTED   Tetrahydrocannabinol NONE DETECTED NONE DETECTED   Barbiturates NONE DETECTED NONE DETECTED    Comment:  DRUG SCREEN FOR MEDICAL PURPOSES ONLY.  IF CONFIRMATION IS NEEDED FOR ANY PURPOSE, NOTIFY LAB WITHIN 5 DAYS.        LOWEST DETECTABLE LIMITS FOR URINE DRUG SCREEN Drug Class       Cutoff (ng/mL) Amphetamine      1000 Barbiturate      200 Benzodiazepine   734 Tricyclics       193 Opiates          300 Cocaine          300 THC              50   Urinalysis, Routine w reflex microscopic (not at [facility])     Status: Abnormal   Collection Time: 04/10/16 12:58 AM  Result Value Ref Range   Color, Urine YELLOW YELLOW   APPearance CLEAR CLEAR   Specific Gravity, Urine 1.014 1.005 - 1.030   pH 7.0 5.0 - 8.0   Glucose, UA NEGATIVE NEGATIVE mg/dL   Hgb urine dipstick NEGATIVE NEGATIVE   Bilirubin Urine NEGATIVE NEGATIVE   Ketones, ur NEGATIVE NEGATIVE mg/dL   Protein, ur NEGATIVE NEGATIVE mg/dL   Nitrite NEGATIVE NEGATIVE   Leukocytes, UA LARGE (A) NEGATIVE  Urine microscopic-add on     Status: Abnormal   Collection Time: 04/10/16 12:58 AM  Result Value Ref Range   Squamous Epithelial / LPF 0-5 (A) NONE SEEN   WBC, UA 6-30 0 - 5 WBC/hpf   RBC / HPF NONE SEEN 0 - 5 RBC/hpf   Bacteria, UA RARE (A) NONE SEEN  CBG monitoring, ED     Status: None   Collection Time: 04/10/16  8:20 AM  Result Value Ref Range   Glucose-Capillary 88 65 - 99 mg/dL    Current Facility-Administered Medications  Medication Dose Route Frequency Provider Last Rate Last Dose  . acetaminophen (TYLENOL) tablet 650 mg  650  mg Oral Q4H PRN Tatyana Kirichenko, PA-C      . alum & mag hydroxide-simeth (MAALOX/MYLANTA) 200-200-20 MG/5ML suspension 30 mL  30 mL Oral PRN Tatyana Kirichenko, PA-C      . benztropine (COGENTIN) tablet 1 mg  1 mg Oral BID Marsena Taff, MD      . ciprofloxacin (CIPRO) tablet 500 mg  500 mg Oral BID Sherwood Gambler, MD   500 mg at 04/10/16 0937  . divalproex (DEPAKOTE ER) 24 hr tablet 500 mg  500 mg Oral BID PC Aarav Burgett, MD      . insulin aspart (novoLOG) injection 0-5 Units  0-5 Units Subcutaneous QHS Tatyana Kirichenko, PA-C      . insulin aspart (novoLOG) injection 0-9 Units  0-9 Units Subcutaneous TID WC Tatyana Kirichenko, PA-C      . ondansetron (ZOFRAN) tablet 4 mg  4 mg Oral Q8H PRN Tatyana Kirichenko, PA-C      . QUEtiapine (SEROQUEL) tablet 50 mg  50 mg Oral QHS Corena Pilgrim, MD       Current Outpatient Prescriptions  Medication Sig Dispense Refill  . benztropine (COGENTIN) 1 MG tablet Take 1 mg by mouth daily.    . divalproex (DEPAKOTE ER) 500 MG 24 hr tablet Take 500 mg by mouth daily.    . haloperidol decanoate (HALDOL DECANOATE) 50 MG/ML injection Inject 150 mg into the muscle every 28 (twenty-eight) days.    Marland Kitchen OLANZapine (ZYPREXA) 5 MG tablet Take 5 mg by mouth daily.      Musculoskeletal: Strength & Muscle Tone: within normal limits Gait & Station: unsteady Patient leans: Front  Psychiatric Specialty Exam: Physical Exam  Psychiatric: Her mood appears anxious. Her affect is labile. Her speech is delayed. She is agitated, aggressive and actively hallucinating. Thought content is paranoid. She expresses impulsivity. She exhibits a depressed mood. She exhibits abnormal recent memory.    Review of Systems  Constitutional: Positive for malaise/fatigue.  HENT: Negative.   Eyes: Negative.   Genitourinary: Negative.   Skin: Negative.   Neurological: Positive for tremors.  Endo/Heme/Allergies: Negative.   Psychiatric/Behavioral: Positive for depression and  hallucinations. The patient is nervous/anxious.     Blood pressure 150/88, pulse 70, temperature 97.9 F (36.6 C), temperature source Oral, resp. rate 20, SpO2 100 %.There is no height or weight on file to calculate BMI.  General Appearance: Casual  Eye Contact:  Minimal  Speech:  Garbled and Pressured  Volume:  Decreased  Mood:  Anxious, Depressed and Dysphoric  Affect:  Constricted  Thought Process:  Disorganized  Orientation:  Other:  only to place  Thought Content:  Illogical, Delusions and Hallucinations: Auditory  Suicidal Thoughts:  No  Homicidal Thoughts:  No  Memory:  Immediate;   Good Recent;   Poor Remote;   Poor  Judgement:  Impaired  Insight:  Lacking  Psychomotor Activity:  Decreased  Concentration:  Concentration: Fair and Attention Span: Fair  Recall:  [employer] of Knowledge:  Poor  Language:  Fair  Akathisia:  No  Handed:  Right  AIMS (if indicated):     Assets:  Social Support  ADL's:  Impaired  Cognition:  Impaired,  Moderate  Sleep:   poor     Treatment Plan Summary: -Plan -Crisis plan -Daily contact with patient to assess and evaluate symptoms and progress in treatment, Medication management. -Discontinue Cogentin -Start Amantadine 151m bid for Tardive dyskinesia/EPS prevention -Increase Depakote to 5046mbid for mood -Start Seroquel 5012mhs for insomnia/psychosis  Disposition: Recommend psychiatric Inpatient admission when medically cleared. Supportive therapy provided about ongoing stressors. Needs geriatric inpatient admission for stabilization  AkiCorena PilgrimD 04/10/2016 10:58 AM

## 2016-04-11 LAB — CBG MONITORING, ED
GLUCOSE-CAPILLARY: 108 mg/dL — AB (ref 65–99)
GLUCOSE-CAPILLARY: 83 mg/dL (ref 65–99)
Glucose-Capillary: 88 mg/dL (ref 65–99)
Glucose-Capillary: 64 mg/dL — ABNORMAL LOW (ref 65–99)
Glucose-Capillary: 87 mg/dL (ref 65–99)

## 2016-04-11 LAB — URINE CULTURE

## 2016-04-11 NOTE — ED Notes (Signed)
Patients cbg was 64 without any symptoms. Patient given 15 grams of carbohydrates.  CBG 87 on recheck. Will continue to monitor.

## 2016-04-11 NOTE — ED Notes (Signed)
Patient breathing even and unlabored, NAD at this time, sitter remains at bedside, will continue to monitor.

## 2016-04-11 NOTE — BH Assessment (Signed)
BHH Assessment Progress Note  This Clinical research associate spoke with patient this date to monitor progress/mental health status check. Patient continues to be disorganized and is not oriented to time/ place. Patient presents with a somewhat flat affect as this writer attempted to interact with her. Collateral from staff nurses state patient has been cooperative but continues to be disorganized. Patient is a poor historian and was difficult to redirect. Patient continues to await placement as appropriate bed placement is investigated.

## 2016-04-11 NOTE — ED Notes (Signed)
Patient sleeping, breathing even and unlabored, NAD at this time, sitter remains at bedside.

## 2016-04-11 NOTE — ED Notes (Signed)
Patient sleeping, breathing even and unlabored, NAD at this time, will continue to monitor 

## 2016-04-12 DIAGNOSIS — F03918 Unspecified dementia, unspecified severity, with other behavioral disturbance: Secondary | ICD-10-CM | POA: Diagnosis present

## 2016-04-12 DIAGNOSIS — F0391 Unspecified dementia with behavioral disturbance: Secondary | ICD-10-CM | POA: Diagnosis not present

## 2016-04-12 DIAGNOSIS — F259 Schizoaffective disorder, unspecified: Secondary | ICD-10-CM

## 2016-04-12 LAB — URINE CULTURE

## 2016-04-12 LAB — CBG MONITORING, ED
GLUCOSE-CAPILLARY: 88 mg/dL (ref 65–99)
Glucose-Capillary: 102 mg/dL — ABNORMAL HIGH (ref 65–99)
Glucose-Capillary: 92 mg/dL (ref 65–99)

## 2016-04-12 NOTE — Clinical Social Work Note (Signed)
CSW left message for patient's niece to return call back regarding patient discharge.  Elray Buba, LCSW [facility] Clinical Social Worker - Weekend Coverage cell #: [phone]

## 2016-04-12 NOTE — Discharge Instructions (Signed)

## 2016-04-12 NOTE — ED Notes (Signed)
Per social worker she spoke to patient's niece concerning discharge and the niece states she will not be able to pick patient up until 6:00 pm today.

## 2016-04-12 NOTE — BHH Suicide Risk Assessment (Signed)
Suicide Risk Assessment  Discharge Assessment   [facility] Discharge Suicide Risk Assessment   Principal Problem: Dementia with behavioral disturbance Discharge Diagnoses:  Patient Active Problem List   Diagnosis Date Noted  . Dementia with behavioral disturbance [F03.91] 04/12/2016    Priority: High  . Encounter for preadmission testing [Z01.818]   . Schizoaffective disorder ([facility]) [F25.9] 01/29/2015    Total Time spent with patient: 45 minutes  Musculoskeletal: Strength & Muscle Tone: within normal limits Gait & Station: normal Patient leans: N/A  Psychiatric Specialty Exam: Physical Exam  Constitutional: She is oriented to person, place, and time. She appears well-developed and well-nourished.  HENT:  Head: Normocephalic.  Neck: Normal range of motion.  Respiratory: Effort normal.  Musculoskeletal: Normal range of motion.  Neurological: She is alert and oriented to person, place, and time.  Skin: Skin is warm and dry.  Psychiatric: Her speech is normal and behavior is normal. Judgment and thought content normal. Her affect is blunt. Cognition and memory are impaired.    Review of Systems  Constitutional: Negative.   HENT: Negative.   Eyes: Negative.   Respiratory: Negative.   Cardiovascular: Negative.   Gastrointestinal: Negative.   Genitourinary: Negative.   Musculoskeletal: Negative.   Skin: Negative.   Neurological: Negative.   Endo/Heme/Allergies: Negative.   Psychiatric/Behavioral: Positive for memory loss.    Blood pressure 114/64, pulse 81, temperature 97.3 F (36.3 C), temperature source Oral, resp. rate 17, SpO2 98 %.There is no height or weight on file to calculate BMI.  General Appearance: Casual  Eye Contact:  Good  Speech:  Normal Rate  Volume:  Normal  Mood:  Euthymic  Affect:  Congruent  Thought Process:  Coherent and Descriptions of Associations: Intact  Orientation:  Full (Time, Place, and Person)  Thought Content:  WDL  Suicidal Thoughts:  No   Homicidal Thoughts:  No  Memory:  Immediate;   Fair Recent;   Poor Remote;   Fair  Judgement:  Fair  Insight:  Fair  Psychomotor Activity:  Normal  Concentration:  Concentration: Fair and Attention Span: Fair  Recall:  [employer] of Knowledge:  Fair  Language:  Good  Akathisia:  No  Handed:  Right  AIMS (if indicated):     Assets:  Housing Leisure Time Resilience Social Support  ADL's:  Intact, needs prompting  Cognition:  Impaired,  Mild  Sleep:      Mental Status Per Nursing Assessment::   On Admission:   Aggressive behaviors   Demographic Factors:  Age 61 or older  Loss Factors: NA  Historical Factors: NA  Risk Reduction Factors:   Sense of responsibility to family, Living with another person, especially a relative, Positive social support and Positive therapeutic relationship  Continued Clinical Symptoms:  Memory issues  Cognitive Features That Contribute To Risk:  None    Suicide Risk:  Minimal: No identifiable suicidal ideation.  Patients presenting with no risk factors but with morbid ruminations; may be classified as minimal risk based on the severity of the depressive symptoms    Plan Of Care/Follow-up recommendations:  Activity:  as tolerated Diet:  heart healthy diet  LORD, JAMISON, NP 04/12/2016, 11:33 AM

## 2016-04-12 NOTE — ED Notes (Signed)
Patient not ready for discharge. Per social work still waiting on a call from niece to return patient into her care.

## 2016-04-12 NOTE — Clinical Social Work Note (Signed)
CSW spoke with patient's niece to arrange for discharge and pick up today as psychiatry cleared patient for discharge.  Niece stated that she could pick patient up at 6pm today.  Information provided to RN.  Marland KitchenElray Buba, LCSW [facility] Clinical Social Worker - Weekend Coverage cell #: [phone]

## 2016-04-12 NOTE — ED Notes (Signed)
Discharge instruction reviewed with niece whom is the to care. Patient discharged to home in the care of niece

## 2016-04-12 NOTE — Clinical Social Work Note (Signed)
CSW provided paperwork per psychiatry request to rescind IVC status.  Patient is being discharged today.  Forms signed and placed in patient chart.  Elray Buba, LCSW [facility] Clinical Social Worker - Weekend Coverage cell #: [phone]

## 2016-04-12 NOTE — Consult Note (Signed)
[facility] Face-to-Face Psychiatry Consult   Reason for Consult:  Decline in cognitive state and aggression Referring Physician:  EDP Patient Identification: Joan Nash MRN:  [id number] Principal Diagnosis: Dementia with behavioral disturbance Diagnosis:   Patient Active Problem List   Diagnosis Date Noted  . Dementia with behavioral disturbance [F03.91] 04/12/2016    Priority: High  . Encounter for preadmission testing [Z01.818]   . Schizoaffective disorder, depressive type (HCC) [F25.1] 01/29/2015    Total Time spent with patient: 45 minutes  Subjective:   Joan Nash is a 70 y.o. female patient does not warrant geropsychiatry at this time, cleared from a psychiatric perspective.  HPI:  70 yo female who was brought to the ED after her niece, her caregiver, IVC'd her for aggression and reports of her recent medication changes not working.  She has been pleasant and cooperative in the ED with no signs of aggression.  Today, she was coherent and told us she lived with her niece and was not suicidal/homicidal or hallucinating.  Stable for discharge.  Past Psychiatric History: dementia, schizoaffective disorder  Risk to Self: Suicidal Ideation: No Suicidal Intent: No Is patient at risk for suicide?: No Suicidal Plan?: No Access to Means: No What has been your use of drugs/alcohol within the last 12 months?: none Intentional Self Injurious Behavior: None Risk to Others: Homicidal Ideation: No Thoughts of Harm to Others: No Current Homicidal Intent: No Current Homicidal Plan: No Access to Homicidal Means: No History of harm to others?: No Assessment of Violence: None Noted Does patient have access to weapons?: No Criminal Charges Pending?: No Does patient have a court date: No Prior Inpatient Therapy: Prior Inpatient Therapy: Yes Prior Therapy Dates: multiple times-last in 2016 Prior Therapy Facilty/Provider(s): [facility] Reason for Treatment: Schizoaffective Prior Outpatient  Therapy: Prior Outpatient Therapy: No Does patient have an ACCT team?: No Does patient have Intensive In-House Services?  : No Does patient have [facility] services? : No Does patient have P4CC services?: No  Past Medical History:  Past Medical History:  Diagnosis Date  . Depression   . Diabetes mellitus   . Hypercholesterolemia   . Hypertension     Past Surgical History:  Procedure Laterality Date  . ABDOMINAL HYSTERECTOMY    . TOOTH EXTRACTION     Family History: History reviewed. No pertinent family history. Family Psychiatric  History: none Social History:  History  Alcohol Use No     History  Drug Use No    Social History   Social History  . Marital status: Single    Spouse name: N/A  . Number of children: N/A  . Years of education: N/A   Social History Main Topics  . Smoking status: Never Smoker  . Smokeless tobacco: None  . Alcohol use No  . Drug use: No  . Sexual activity: Not Asked   Other Topics Concern  . None   Social History Narrative  . None   Additional Social History:    Allergies:   Allergies  Allergen Reactions  . Penicillins Anaphylaxis    Has patient had a PCN reaction causing immediate rash, facial/tongue/throat swelling, SOB or lightheadedness with hypotension: yes Has patient had a PCN reaction causing severe rash involving mucus membranes or skin necrosis: no Has patient had a PCN reaction that required hospitalization yes Has patient had a PCN reaction occurring within the last 10 years: no If all of the above answers are "NO", then may proceed with Cephalosporin use.     Labs:  Results for orders placed or performed during the hospital encounter of 04/09/16 (from the past 48 hour(s))  CBG monitoring, ED     Status: None   Collection Time: 04/10/16  5:50 PM  Result Value Ref Range   Glucose-Capillary 90 65 - 99 mg/dL  CBG monitoring, ED     Status: Abnormal   Collection Time: 04/10/16  9:03 PM  Result Value Ref Range    Glucose-Capillary 109 (H) 65 - 99 mg/dL  CBG monitoring, ED     Status: None   Collection Time: 04/11/16  7:42 AM  Result Value Ref Range   Glucose-Capillary 88 65 - 99 mg/dL  CBG monitoring, ED     Status: Abnormal   Collection Time: 04/11/16 12:01 PM  Result Value Ref Range   Glucose-Capillary 64 (L) 65 - 99 mg/dL   Comment 1 Notify RN   CBG monitoring, ED     Status: None   Collection Time: 04/11/16 12:31 PM  Result Value Ref Range   Glucose-Capillary 87 65 - 99 mg/dL   Comment 1 Notify RN   Urine culture     Status: Abnormal   Collection Time: 04/11/16  2:20 PM  Result Value Ref Range   Specimen Description URINE, CLEAN CATCH    Special Requests NONE    Culture (A)     <10,000 COLONIES/mL INSIGNIFICANT GROWTH Performed at [facility]    Report Status 04/12/2016 FINAL   CBG monitoring, ED     Status: Abnormal   Collection Time: 04/11/16  5:08 PM  Result Value Ref Range   Glucose-Capillary 108 (H) 65 - 99 mg/dL   Comment 1 Notify RN   CBG monitoring, ED     Status: None   Collection Time: 04/11/16 10:23 PM  Result Value Ref Range   Glucose-Capillary 83 65 - 99 mg/dL  CBG monitoring, ED     Status: Abnormal   Collection Time: 04/12/16  8:09 AM  Result Value Ref Range   Glucose-Capillary 102 (H) 65 - 99 mg/dL   Comment 1 Notify RN     Current Facility-Administered Medications  Medication Dose Route Frequency Provider Last Rate Last Dose  . acetaminophen (TYLENOL) tablet 650 mg  650 mg Oral Q4H PRN Tatyana Kirichenko, PA-C      . alum & mag hydroxide-simeth (MAALOX/MYLANTA) 200-200-20 MG/5ML suspension 30 mL  30 mL Oral PRN Tatyana Kirichenko, PA-C      . amantadine (SYMMETREL) capsule 100 mg  100 mg Oral BID Thedore Mins, MD   100 mg at 04/12/16 0903  . ciprofloxacin (CIPRO) tablet 500 mg  500 mg Oral BID Pricilla Loveless, MD   500 mg at 04/12/16 1610  . divalproex (DEPAKOTE ER) 24 hr tablet 500 mg  500 mg Oral BID PC Mojeed Akintayo, MD   500 mg at 04/12/16  0903  . insulin aspart (novoLOG) injection 0-5 Units  0-5 Units Subcutaneous QHS Tatyana Kirichenko, PA-C      . insulin aspart (novoLOG) injection 0-9 Units  0-9 Units Subcutaneous TID WC Tatyana Kirichenko, PA-C      . ondansetron (ZOFRAN) tablet 4 mg  4 mg Oral Q8H PRN Tatyana Kirichenko, PA-C      . QUEtiapine (SEROQUEL) tablet 50 mg  50 mg Oral QHS Thedore Mins, MD   50 mg at 04/11/16 2221   Current Outpatient Prescriptions  Medication Sig Dispense Refill  . benztropine (COGENTIN) 1 MG tablet Take 1 mg by mouth daily.    . divalproex (DEPAKOTE ER)  500 MG 24 hr tablet Take 500 mg by mouth daily.    . haloperidol decanoate (HALDOL DECANOATE) 50 MG/ML injection Inject 150 mg into the muscle every 28 (twenty-eight) days.    Marland Kitchen OLANZapine (ZYPREXA) 5 MG tablet Take 5 mg by mouth daily.      Musculoskeletal: Strength & Muscle Tone: within normal limits Gait & Station: normal Patient leans: N/A  Psychiatric Specialty Exam: Physical Exam  Constitutional: She is oriented to person, place, and time. She appears well-developed and well-nourished.  HENT:  Head: Normocephalic.  Neck: Normal range of motion.  Respiratory: Effort normal.  Musculoskeletal: Normal range of motion.  Neurological: She is alert and oriented to person, place, and time.  Skin: Skin is warm and dry.  Psychiatric: Her speech is normal and behavior is normal. Judgment and thought content normal. Her affect is blunt. Cognition and memory are impaired.    Review of Systems  Constitutional: Negative.   HENT: Negative.   Eyes: Negative.   Respiratory: Negative.   Cardiovascular: Negative.   Gastrointestinal: Negative.   Genitourinary: Negative.   Musculoskeletal: Negative.   Skin: Negative.   Neurological: Negative.   Endo/Heme/Allergies: Negative.   Psychiatric/Behavioral: Positive for memory loss.    Blood pressure 114/64, pulse 81, temperature 97.3 F (36.3 C), temperature source Oral, resp. rate 17, SpO2  98 %.There is no height or weight on file to calculate BMI.  General Appearance: Casual  Eye Contact:  Good  Speech:  Normal Rate  Volume:  Normal  Mood:  Euthymic  Affect:  Congruent  Thought Process:  Coherent and Descriptions of Associations: Intact  Orientation:  Full (Time, Place, and Person)  Thought Content:  WDL  Suicidal Thoughts:  No  Homicidal Thoughts:  No  Memory:  Immediate;   Fair Recent;   Poor Remote;   Fair  Judgement:  Fair  Insight:  Fair  Psychomotor Activity:  Normal  Concentration:  Concentration: Fair and Attention Span: Fair  Recall:  [employer] of Knowledge:  Fair  Language:  Good  Akathisia:  No  Handed:  Right  AIMS (if indicated):     Assets:  Housing Leisure Time Resilience Social Support  ADL's:  Intact, needs prompting  Cognition:  Impaired,  Mild  Sleep:        Treatment Plan Summary: Daily contact with patient to assess and evaluate symptoms and progress in treatment, Medication management and Plan dementia with behavioral disturbances:  -Crisis stabilization -Medication management:  Continue medical medications along with Depakote 500 mg BID for mood stabilization and Seroquel 50 mg at bedtime for mood and behaviors. -Individual counseling  Disposition: No evidence of imminent risk to self or others at present.    Nanine Means, NP 04/12/2016 11:26 AM

## 2016-05-04 DIAGNOSIS — E119 Type 2 diabetes mellitus without complications: Secondary | ICD-10-CM | POA: Diagnosis not present

## 2016-05-04 DIAGNOSIS — I10 Essential (primary) hypertension: Secondary | ICD-10-CM | POA: Diagnosis not present

## 2016-05-04 DIAGNOSIS — F209 Schizophrenia, unspecified: Secondary | ICD-10-CM | POA: Diagnosis not present

## 2016-05-04 DIAGNOSIS — Z111 Encounter for screening for respiratory tuberculosis: Secondary | ICD-10-CM | POA: Diagnosis not present

## 2016-05-07 DIAGNOSIS — F209 Schizophrenia, unspecified: Secondary | ICD-10-CM | POA: Diagnosis not present

## 2016-06-01 DIAGNOSIS — F209 Schizophrenia, unspecified: Secondary | ICD-10-CM | POA: Diagnosis not present

## 2016-06-04 DIAGNOSIS — F209 Schizophrenia, unspecified: Secondary | ICD-10-CM | POA: Diagnosis not present

## 2016-06-15 DIAGNOSIS — Z23 Encounter for immunization: Secondary | ICD-10-CM | POA: Diagnosis not present

## 2016-07-02 DIAGNOSIS — F209 Schizophrenia, unspecified: Secondary | ICD-10-CM | POA: Diagnosis not present

## 2016-07-30 DIAGNOSIS — F209 Schizophrenia, unspecified: Secondary | ICD-10-CM | POA: Diagnosis not present

## 2016-08-08 IMAGING — CR DG CHEST 2V
2 series · 2 of 2 positions shown · non-contrast
Comparison: None.

CLINICAL DATA: Geriatric psychosis.

EXAM:
CHEST  2 VIEW

[w chest pa]
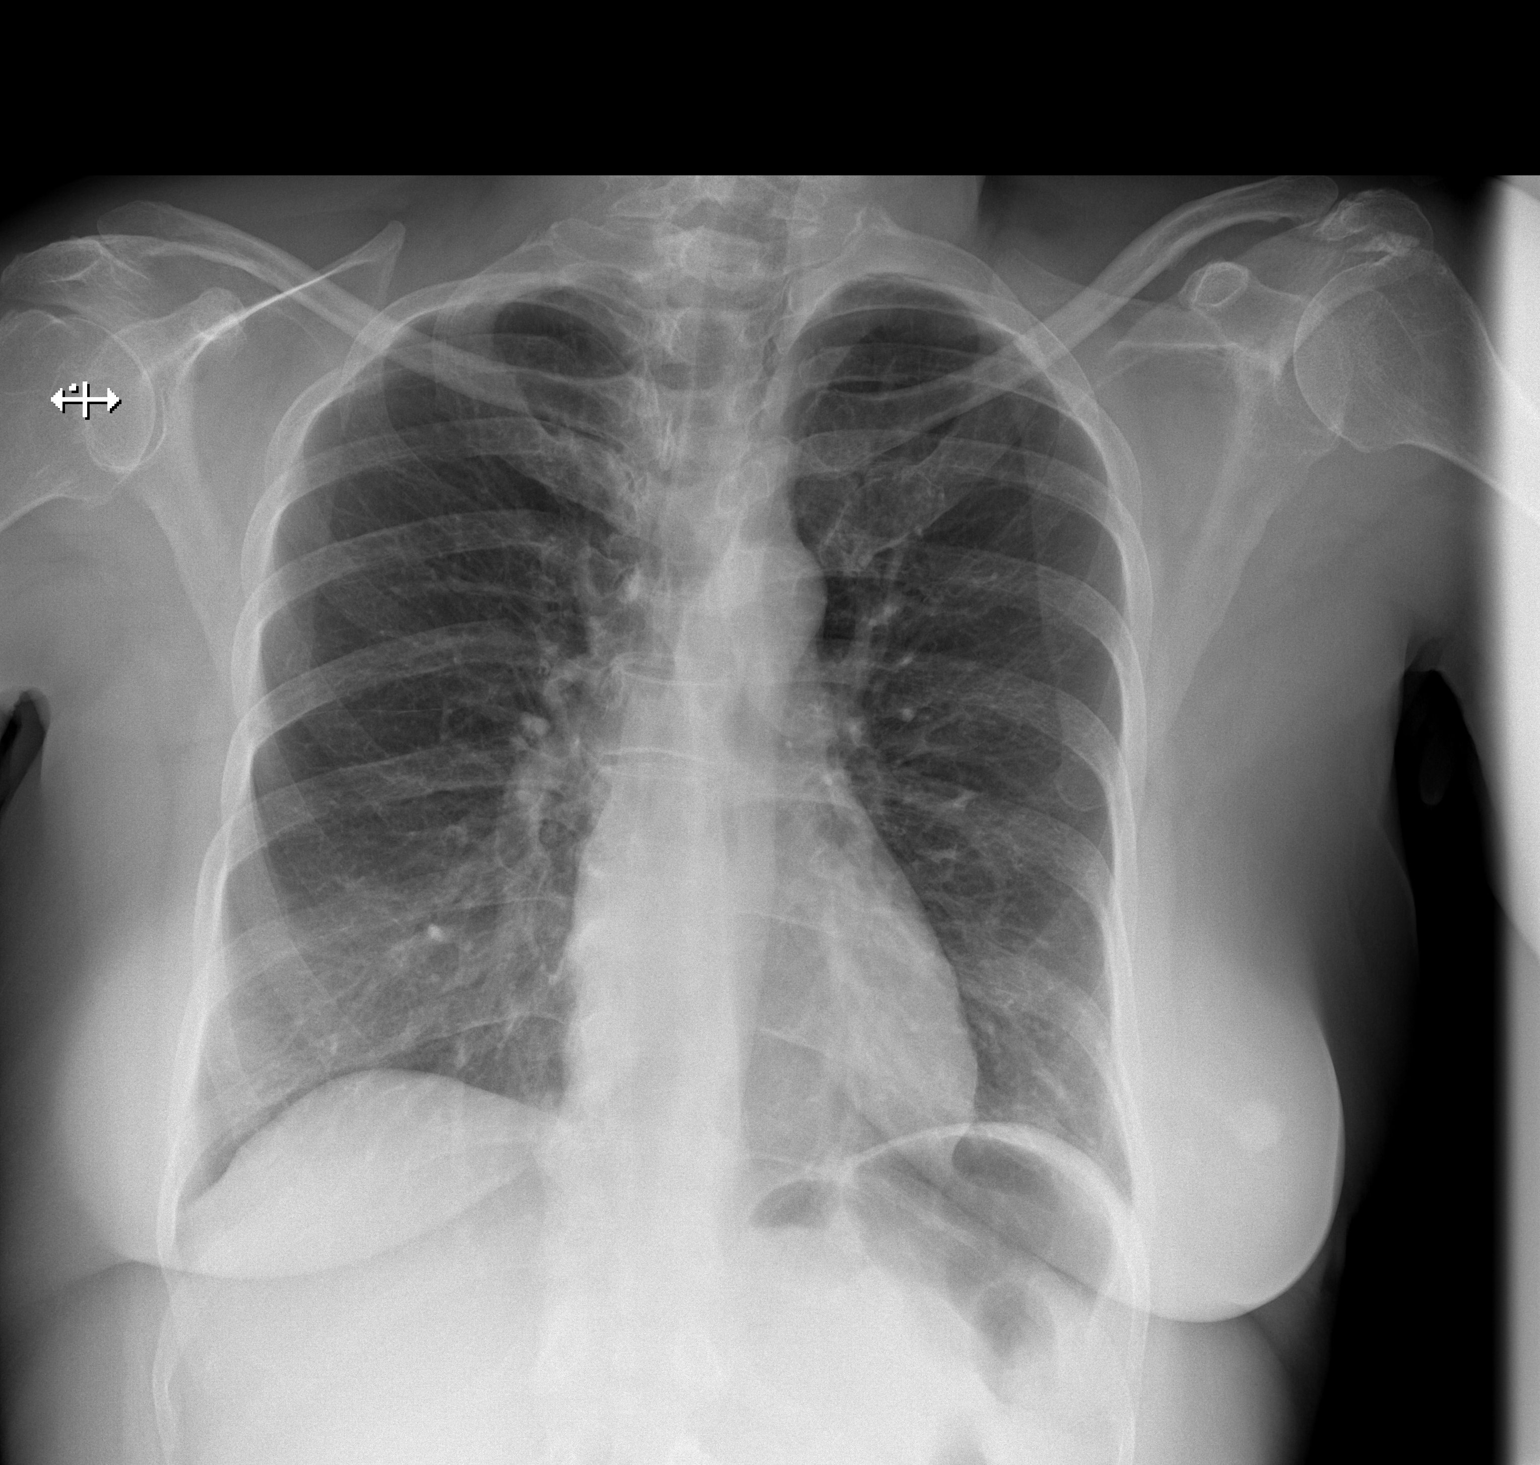

[w chest lat]
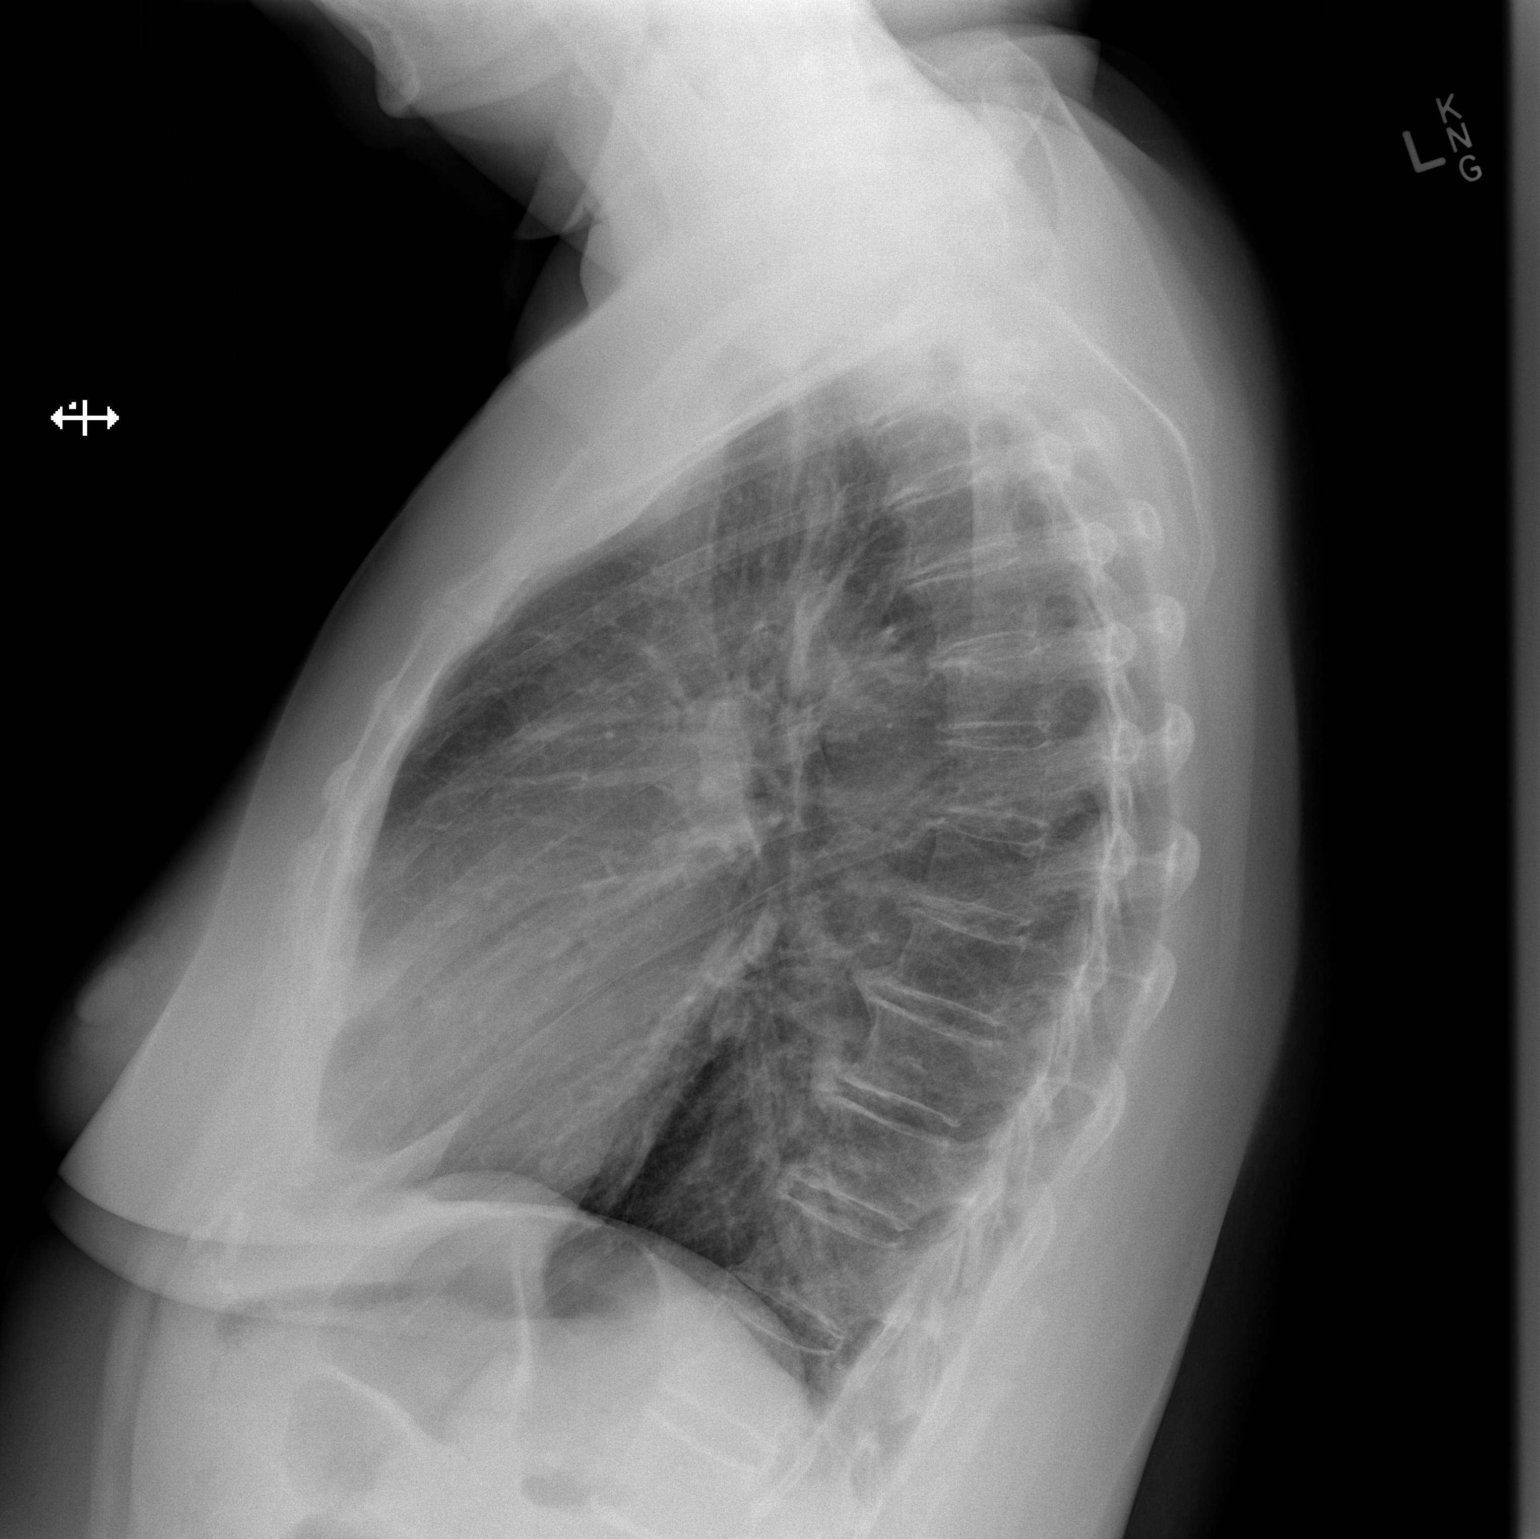

[2 of 2 positions shown; findings below may reference images not displayed]

FINDINGS: The heart size and mediastinal contours are within normal limits.
Both lungs are clear. The visualized skeletal structures are
unremarkable.
IMPRESSION: No active cardiopulmonary disease.

## 2016-08-17 DIAGNOSIS — F209 Schizophrenia, unspecified: Secondary | ICD-10-CM | POA: Diagnosis not present

## 2016-08-27 DIAGNOSIS — F209 Schizophrenia, unspecified: Secondary | ICD-10-CM | POA: Diagnosis not present

## 2016-09-24 DIAGNOSIS — F209 Schizophrenia, unspecified: Secondary | ICD-10-CM | POA: Diagnosis not present

## 2016-10-22 DIAGNOSIS — F209 Schizophrenia, unspecified: Secondary | ICD-10-CM | POA: Diagnosis not present

## 2016-11-09 DIAGNOSIS — Z79899 Other long term (current) drug therapy: Secondary | ICD-10-CM | POA: Diagnosis not present

## 2016-11-09 DIAGNOSIS — F209 Schizophrenia, unspecified: Secondary | ICD-10-CM | POA: Diagnosis not present

## 2016-11-19 DIAGNOSIS — F209 Schizophrenia, unspecified: Secondary | ICD-10-CM | POA: Diagnosis not present

## 2016-11-21 ENCOUNTER — Encounter (HOSPITAL_COMMUNITY): Payer: Self-pay

## 2016-11-21 ENCOUNTER — Emergency Department (HOSPITAL_COMMUNITY)
Admission: EM | Admit: 2016-11-21 | Discharge: 2016-11-23 | Disposition: A | Discharge status: Other Acute Inpatient Hospital | Payer: Medicare Other | Attending: Emergency Medicine | Admitting: Emergency Medicine

## 2016-11-21 DIAGNOSIS — I10 Essential (primary) hypertension: Secondary | ICD-10-CM | POA: Diagnosis not present

## 2016-11-21 DIAGNOSIS — R4182 Altered mental status, unspecified: Secondary | ICD-10-CM | POA: Diagnosis present

## 2016-11-21 DIAGNOSIS — E119 Type 2 diabetes mellitus without complications: Secondary | ICD-10-CM | POA: Diagnosis not present

## 2016-11-21 DIAGNOSIS — F0391 Unspecified dementia with behavioral disturbance: Secondary | ICD-10-CM | POA: Diagnosis not present

## 2016-11-21 DIAGNOSIS — R531 Weakness: Secondary | ICD-10-CM | POA: Diagnosis not present

## 2016-11-21 DIAGNOSIS — F22 Delusional disorders: Secondary | ICD-10-CM | POA: Diagnosis not present

## 2016-11-21 DIAGNOSIS — Z79899 Other long term (current) drug therapy: Secondary | ICD-10-CM | POA: Insufficient documentation

## 2016-11-21 DIAGNOSIS — Z880 Allergy status to penicillin: Secondary | ICD-10-CM | POA: Diagnosis not present

## 2016-11-21 DIAGNOSIS — F03918 Unspecified dementia, unspecified severity, with other behavioral disturbance: Secondary | ICD-10-CM | POA: Diagnosis present

## 2016-11-21 DIAGNOSIS — F259 Schizoaffective disorder, unspecified: Secondary | ICD-10-CM | POA: Diagnosis present

## 2016-11-21 DIAGNOSIS — F250 Schizoaffective disorder, bipolar type: Secondary | ICD-10-CM | POA: Diagnosis not present

## 2016-11-21 DIAGNOSIS — R404 Transient alteration of awareness: Secondary | ICD-10-CM | POA: Diagnosis not present

## 2016-11-21 DIAGNOSIS — Z049 Encounter for examination and observation for unspecified reason: Secondary | ICD-10-CM

## 2016-11-21 HISTORY — DX: Schizophrenia, unspecified: F20.9

## 2016-11-21 LAB — RAPID URINE DRUG SCREEN, HOSP PERFORMED
Amphetamines: NOT DETECTED
Barbiturates: NOT DETECTED
Benzodiazepines: NOT DETECTED
Cocaine: NOT DETECTED
Opiates: NOT DETECTED
Tetrahydrocannabinol: NOT DETECTED

## 2016-11-21 LAB — CBC WITH DIFFERENTIAL/PLATELET
Basophils Absolute: 0 10*3/uL (ref 0.0–0.1)
Basophils Relative: 0 %
Eosinophils Absolute: 0.1 10*3/uL (ref 0.0–0.7)
Eosinophils Relative: 2 %
HCT: 36.6 % (ref 36.0–46.0)
Hemoglobin: 11.8 g/dL — ABNORMAL LOW (ref 12.0–15.0)
Lymphocytes Relative: 32 %
Lymphs Abs: 1.6 10*3/uL (ref 0.7–4.0)
MCH: 29.2 pg (ref 26.0–34.0)
MCHC: 32.2 g/dL (ref 30.0–36.0)
MCV: 90.6 fL (ref 78.0–100.0)
Monocytes Absolute: 0.6 10*3/uL (ref 0.1–1.0)
Monocytes Relative: 12 %
Neutro Abs: 2.8 10*3/uL (ref 1.7–7.7)
Neutrophils Relative %: 54 %
Platelets: 211 10*3/uL (ref 150–400)
RBC: 4.04 MIL/uL (ref 3.87–5.11)
RDW: 14.8 % (ref 11.5–15.5)
WBC: 5.1 10*3/uL (ref 4.0–10.5)

## 2016-11-21 LAB — COMPREHENSIVE METABOLIC PANEL
ALT: 10 U/L — ABNORMAL LOW (ref 14–54)
AST: 14 U/L — ABNORMAL LOW (ref 15–41)
Albumin: 3.2 g/dL — ABNORMAL LOW (ref 3.5–5.0)
Alkaline Phosphatase: 35 U/L — ABNORMAL LOW (ref 38–126)
Anion gap: 5 (ref 5–15)
BUN: 17 mg/dL (ref 6–20)
CO2: 28 mmol/L (ref 22–32)
Calcium: 9.1 mg/dL (ref 8.9–10.3)
Chloride: 109 mmol/L (ref 101–111)
Creatinine, Ser: 0.63 mg/dL (ref 0.44–1.00)
GFR calc Af Amer: >60 mL/min (ref >60)
GFR calc non Af Amer: >60 mL/min (ref >60)
Glucose, Bld: 112 mg/dL — ABNORMAL HIGH (ref 65–99)
Potassium: 3.7 mmol/L (ref 3.5–5.1)
Sodium: 142 mmol/L (ref 135–145)
Total Bilirubin: 0.3 mg/dL (ref 0.3–1.2)
Total Protein: 7.1 g/dL (ref 6.5–8.1)

## 2016-11-21 LAB — ETHANOL: Alcohol, Ethyl (B): <5 mg/dL (ref <5)

## 2016-11-21 NOTE — ED Notes (Signed)
TTS consult in process at bedside at this time via telepsych. 

## 2016-11-21 NOTE — ED Triage Notes (Signed)
Patient  found by family laying on the floor with large dent on the wall.  Pt niece got IVC paper. Pt niece concern about pt having stoke.simple test stroke was done by ems and was negative. Vital sign stable. Pt have hx of schizophrenia.

## 2016-11-21 NOTE — ED Provider Notes (Signed)
[facility]-EMERGENCY DEPT Provider Note   CSN: [id number] Arrival date & time: 11/21/16  1847  By signing my name below, I, Joan Nash, attest that this documentation has been prepared under the direction and in the presence of physician practitioner, Joan Razor, MD. Electronically Signed: Linna Nash, Scribe. 11/21/2016. 7:41 PM.  History   Chief Complaint Chief Complaint  Patient presents with  . Altered Mental Status    The history is provided by the patient and a relative. No language interpreter was used.     HPI Comments: LEVEL 5 CAVEAT DUE TO ALTERED MENTAL STATUS Joan Nash is a 71 y.o. female with PMHx including depression, dementia and schizophrenia who presents to the Emergency Department via EMS for evaluation of altered mental status over the last several days. Pt's niece/caretaker reports that patient has been experiencing auditory hallucinations and has a h/o the same due to schizophrenia. Niece reports that patient has been referring to herself by a different name Joan Nash) and her behavior has generally been erratic for several days. Per niece, patient has also not been sleeping well. Niece states patient is typically calm and quiet at baseline. No recent medication or environmental changes. Patient falls and loses her balance on a regular basis per her niece and has had some recent falls without any sustained trauma. Pt is followed by a psychiatrist.  Past Medical History:  Diagnosis Date  . Depression   . Diabetes mellitus   . Hypercholesterolemia   . Hypertension   . Schizophrenia [facility])     Patient Active Problem List   Diagnosis Date Noted  . Dementia with behavioral disturbance 04/12/2016  . Encounter for preadmission testing   . Schizoaffective disorder (HCC) 01/29/2015    Past Surgical History:  Procedure Laterality Date  . ABDOMINAL HYSTERECTOMY    . TOOTH EXTRACTION      OB History    No data available       Home Medications     Prior to Admission medications   Medication Sig Start Date End Date Taking? Authorizing Provider  benztropine (COGENTIN) 1 MG tablet Take 1 mg by mouth daily.    Historical Provider, MD  divalproex (DEPAKOTE ER) 500 MG 24 hr tablet Take 500 mg by mouth daily.    Historical Provider, MD  haloperidol decanoate (HALDOL DECANOATE) 50 MG/ML injection Inject 150 mg into the muscle every 28 (twenty-eight) days.    Historical Provider, MD  OLANZapine (ZYPREXA) 5 MG tablet Take 5 mg by mouth daily.    Historical Provider, MD    Family History History reviewed. No pertinent family history.  Social History Social History  Substance Use Topics  . Smoking status: Never Smoker  . Smokeless tobacco: Never Used  . Alcohol use No     Allergies   Penicillins   Review of Systems Review of Systems  Unable to perform ROS: Mental status change    Physical Exam Updated Vital Signs BP 142/82 (BP Location: Left Arm)   Pulse 72   Temp 98.5 F (36.9 C) (Oral)   Resp 16   Ht 5\' 7"  (1.702 m)   Wt 212 lb (96.2 kg)   SpO2 99%   BMI 33.20 kg/m   Physical Exam  Constitutional: She appears well-developed and well-nourished.  No acute distress. Says she is Joan Nash. Most of her responses are mumbled and hard to discern. Repetitive tongue movements.  HENT:  Head: Normocephalic.  Right Ear: External ear normal.  Left Ear: External ear normal.  Nose:  Nose normal.  Mouth/Throat: Oropharynx is clear and moist.  Eyes: Conjunctivae are normal. Right eye exhibits no discharge. Left eye exhibits no discharge.  Neck: Normal range of motion.  Cardiovascular: Normal rate, regular rhythm and normal heart sounds.   No murmur heard. Pulmonary/Chest: Effort normal and breath sounds normal. No respiratory distress. She has no wheezes. She has no rales.  Abdominal: Soft. She exhibits no distension. There is no tenderness. There is no rebound and no guarding.  Musculoskeletal: Normal range of motion.  She exhibits no edema or tenderness.  Neurological: She is alert. No cranial nerve deficit. Coordination normal.  Follows simple commands. No focal or motor deficit.  Skin: Skin is warm and dry. No rash noted. No erythema. No pallor.  Psychiatric: She has a normal mood and affect. Her behavior is normal.  Nursing note and vitals reviewed.   ED Treatments / Results  Labs (all labs ordered are listed, but only abnormal results are displayed) Labs Reviewed  COMPREHENSIVE METABOLIC PANEL - Abnormal; Notable for the following:       Result Value   Glucose, Bld 112 (*)    Albumin 3.2 (*)    AST 14 (*)    ALT 10 (*)    Alkaline Phosphatase 35 (*)    All other components within normal limits  CBC WITH DIFFERENTIAL/PLATELET - Abnormal; Notable for the following:    Hemoglobin 11.8 (*)    All other components within normal limits  ETHANOL  RAPID URINE DRUG SCREEN, HOSP PERFORMED    EKG  EKG Interpretation None       Radiology No results found.  Procedures Procedures (including critical care time)  DIAGNOSTIC STUDIES: Oxygen Saturation is 99% on RA, normal by my interpretation.    COORDINATION OF CARE: 7:50 PM Discussed treatment plan with pt's niece at bedside and she agreed to plan.  Medications Ordered in ED Medications - No data to display   Initial Impression / Assessment and Plan / ED Course  I have reviewed the triage vital signs and the nursing notes.  Pertinent labs & imaging results that were available during my care of the patient were reviewed by me and considered in my medical decision making (see chart for details).     71yF with change in her baseline psychiatric symptoms. Medically cleared. Psych eval.   Final Clinical Impressions(s) / ED Diagnoses   Final diagnoses:  Paranoia (HCC)    New Prescriptions New Prescriptions   No medications on file   I personally preformed the services scribed in my presence. The recorded information has been  reviewed is accurate. Joan RazorStephen Dayanara Sherrill, MD.    Joan RazorStephen Charnetta Wulff, MD 12/01/16 [phone]

## 2016-11-21 NOTE — ED Notes (Signed)
Bed: [id number] Expected date:  Expected time:  Means of arrival:  Comments: IVC with [employer]

## 2016-11-21 NOTE — ED Notes (Signed)
Pt's contact:  Glinda (niece/caregiver)--- tel# [phone]

## 2016-11-21 NOTE — BH Assessment (Signed)
Called Rn in ED to order cart for TTS Ruslan Mccabe K. Sherlon HandingHarris, LCAS-A, LPC-A, [facility]  Counselor 11/21/2016 10:27 PM

## 2016-11-21 NOTE — ED Notes (Signed)
Bagged blue nightgown and blue underwear

## 2016-11-21 NOTE — Progress Notes (Signed)
Per Nira ConnJason Berry, NP meets inpatient criteria, recommend Ger-Psych referral Aundre Hietala K. Sherlon HandingHarris, LCAS-A, LPC-A, [facility]  Counselor 11/21/2016 11:03 PM

## 2016-11-21 NOTE — BH Assessment (Signed)
Tele Assessment Note   Joan Nash is an 71 y.o. female, African American who presents to [facility] per ED report: Per IVC papers:  A danger to self, to wit: diagnosed with schizophrenic and was hospitalized for same in 2017; believes someone is "after her"; hearing voices to which she responds "Leave me alone"; petitioner believes she may have had a stroke because she has difficulty using her right side and has become a fall risk. Per ED report: PMHx including depression, dementia and schizophrenia who presents to the Emergency Department via EMS for evaluation of altered mental status over the last several days. Pt's niece/caretaker reports that patient has been experiencing auditory hallucinations and has a h/o the same due to schizophrenia. Niece reports that patient has been referring to herself by a different name [facility]) and her behavior has generally been erratic for several days. Per niece, patient has also not been sleeping well. Niece states patient is typically calm and quiet at baseline. No recent medication or environmental changes. Patient falls and loses her balance on a regular basis per her niece and has had some recent falls without any sustained trauma. Pt is followed by a psychiatrist.  Patient was at times hard to follow and voice seemed slurred/slow. Patient is residing with niece, c/o per IVC is altered mental state. Per niece[ via phone conversation] pt has been hearing voices and talking to voices states pt. Had Haldol shot Thursday and after started having adverse reaction believing pt. Is [facility], and pacing off and on. Per niece, is concerned about altered mental state at this time since it has gotten progressively worse in past 8 days.  Patient denies current SI/HI and AVH. , but  Per niece has AVH with delusions of calling self [facility] as well. Patient has been seen inpatient for gero-psych care last at [facility]  In 2016 for similar  complaints.Patient is seen outpatient via psych [unspecified]. Niece confirmed is guardian and has guardianship.    Patient is dressed in scrubs and is alert and oriented, but extent is questionable because pat. Primarily talks slow/slurred mostly incoherent. Patient motor behavior appeared normal. Patient thought process is UTA at this time. Patient does not appear to be responding to internal stimuli. Patient was cooperative throughout the assessment and Niece as guardian  states that she is agreeable to inpatient psychiatric treatment.  Diagnosis: Schizophrenia  Past Medical History:  Past Medical History:  Diagnosis Date  . Depression   . Diabetes mellitus   . Hypercholesterolemia   . Hypertension   . Schizophrenia [facility])     Past Surgical History:  Procedure Laterality Date  . ABDOMINAL HYSTERECTOMY    . TOOTH EXTRACTION      Family History: History reviewed. No pertinent family history.  Social History:  reports that she has never smoked. She has never used smokeless tobacco. She reports that she does not drink alcohol or use drugs.  Additional Social History:  Alcohol / Drug Use Pain Medications: SEE MAR Prescriptions: SEE MAR Over the Counter: SEE MAR History of alcohol / drug use?: No history of alcohol / drug abuse  CIWA: CIWA-Ar BP: 149/72 Pulse Rate: 72 COWS:    PATIENT STRENGTHS: (choose at least two) Motivation for treatment/growth Supportive family/friends  Allergies:  Allergies  Allergen Reactions  . Penicillins Anaphylaxis    Has patient had a PCN reaction causing immediate rash, facial/tongue/throat swelling, SOB or lightheadedness with hypotension: yes Has patient had a PCN reaction causing severe  rash involving mucus membranes or skin necrosis: no Has patient had a PCN reaction that required hospitalization yes Has patient had a PCN reaction occurring within the last 10 years: no If all of the above answers are "NO", then may proceed with  Cephalosporin use.     Home Medications:  (Not in a hospital admission)  OB/GYN Status:  No LMP recorded. Patient is postmenopausal.  General Assessment Data Location of Assessment: [facility] TTS Assessment: In system Is this a Tele or Face-to-Face Assessment?: Tele Assessment Is this an Initial Assessment or a Re-assessment for this encounter?: Initial Assessment Marital status: Single Is patient pregnant?: No Pregnancy Status: No Living Arrangements: Other relatives (niece) Can pt return to current living arrangement?: Yes Admission Status: Involuntary Is patient capable of signing voluntary admission?: No Referral Source: Other Insurance type: Medicare     Crisis Care Plan Living Arrangements: Other relatives (niece) Legal Guardian: Other relative (niece) Name of Psychiatrist: unknown Name of Therapist: none  Education Status Is patient currently in school?: No Current Grade: n/a Highest grade of school patient has completed: unknown Name of school: n/a Contact person: niece  Risk to self with the past 6 months Suicidal Ideation: No Has patient been a risk to self within the past 6 months prior to admission? : No Suicidal Intent: No Has patient had any suicidal intent within the past 6 months prior to admission? : No Is patient at risk for suicide?: No Suicidal Plan?: No Has patient had any suicidal plan within the past 6 months prior to admission? : No Access to Means: No What has been your use of drugs/alcohol within the last 12 months?: none Previous Attempts/Gestures: No How many times?: 0 Other Self Harm Risks: none Triggers for Past Attempts: None known Intentional Self Injurious Behavior: None Family Suicide History: No Recent stressful life event(s): Recent negative physical changes (altered mental status) Persecutory voices/beliefs?: No Depression: Yes Depression Symptoms: Fatigue, Guilt, Loss of interest in usual pleasures Substance abuse history  and/or treatment for substance abuse?: No Suicide prevention information given to non-admitted patients: Not applicable  Risk to Others within the past 6 months Homicidal Ideation: No Does patient have any lifetime risk of violence toward others beyond the six months prior to admission? : No Thoughts of Harm to Others: No Current Homicidal Intent: No Current Homicidal Plan: No Access to Homicidal Means: No Identified Victim: none History of harm to others?: No Assessment of Violence: None Noted Violent Behavior Description: none noted Does patient have access to weapons?: No Criminal Charges Pending?: No Does patient have a court date: No Is patient on probation?: No  Psychosis Hallucinations: None noted Delusions: None noted  Mental Status Report Appearance/Hygiene: In scrubs Eye Contact: Fair Motor Activity: Freedom of movement Speech: Incoherent, Slow, Slurred Level of Consciousness: Alert Mood: Despair Affect: Depressed Anxiety Level: Moderate Thought Processes: Unable to Assess Judgement: Partial Orientation: Unable to assess Obsessive Compulsive Thoughts/Behaviors: Moderate  Cognitive Functioning Concentration: Decreased Memory: Unable to Assess IQ: Average Insight: Fair Impulse Control: Unable to Assess Appetite: Fair Weight Loss: 0 Weight Gain: 0 Sleep: No Change Total Hours of Sleep: 8 Vegetative Symptoms: None  ADLScreening [facility] Assessment Services) Patient's cognitive ability adequate to safely complete daily activities?: Yes Patient able to express need for assistance with ADLs?: Yes Independently performs ADLs?: Yes (appropriate for developmental age)  Prior Inpatient Therapy Prior Inpatient Therapy: Yes Prior Therapy Dates: 2016 Prior Therapy Facilty/Provider(s): [facility] Reason for Treatment: Gero/psych  Prior Outpatient Therapy Prior Outpatient  Therapy: Yes Prior Therapy Dates: current Prior Therapy Facilty/Provider(s): unknown Reason  for Treatment: unspecified Does patient have an ACCT team?: Unknown Does patient have Intensive In-House Services?  : Unknown Does patient have [facility] services? : Unknown Does patient have P4CC services?: Unknown  ADL Screening (condition at time of admission) Patient's cognitive ability adequate to safely complete daily activities?: Yes Is the patient deaf or have difficulty hearing?: No Does the patient have difficulty seeing, even when wearing glasses/contacts?: No Does the patient have difficulty concentrating, remembering, or making decisions?: Yes (UTA full extent) Patient able to express need for assistance with ADLs?: Yes Does the patient have difficulty dressing or bathing?: No Independently performs ADLs?: Yes (appropriate for developmental age) Does the patient have difficulty walking or climbing stairs?: Yes Weakness of Legs: Both (pt denies, but per ED report falls)       Abuse/Neglect Assessment (Assessment to be complete while patient is alone) Physical Abuse: Denies Verbal Abuse: Denies Sexual Abuse: Denies Exploitation of patient/patient's resources: Denies Self-Neglect: Denies Values / Beliefs Cultural Requests During Hospitalization: None Spiritual Requests During Hospitalization: None   Advance Directives (For Healthcare) Does Patient Have a Medical Advance Directive?: No (unknown niece is listed as caretaker)    Additional Information 1:1 In Past 12 Months?: No CIRT Risk: No Elopement Risk: No Does patient have medical clearance?: Yes     Disposition: Per Nira ConnJason Berry, NP meets inpatient criteria, recommend Gero-Psych referral.  Disposition Initial Assessment Completed for this Encounter: Yes Disposition of Patient: Other dispositions (TBD)  Joan Nash 11/21/2016 10:47 PM

## 2016-11-21 NOTE — ED Notes (Signed)
Per IVC papers:  A danger to self, to wit: diagnosed with schizophrenic and was hospitalized for same in 2017; believes someone is "after her"; hearing voices to which she responds "Leave me alone"; petitioner believes she may have had a stroke because she has difficulty using her right side and has become a fall risk.

## 2016-11-22 ENCOUNTER — Emergency Department (HOSPITAL_COMMUNITY): Payer: Medicare Other

## 2016-11-22 DIAGNOSIS — Z880 Allergy status to penicillin: Secondary | ICD-10-CM | POA: Diagnosis not present

## 2016-11-22 DIAGNOSIS — F250 Schizoaffective disorder, bipolar type: Secondary | ICD-10-CM

## 2016-11-22 DIAGNOSIS — Z79899 Other long term (current) drug therapy: Secondary | ICD-10-CM

## 2016-11-22 MED ORDER — FLUOXETINE HCL 20 MG PO CAPS
20.0000 mg | ORAL_CAPSULE | Freq: Every day | ORAL | Status: DC
  Administered 2016-11-22: 20 mg via ORAL
  Filled 2016-11-22: qty 1

## 2016-11-22 MED ORDER — LISINOPRIL 10 MG PO TABS
10.0000 mg | ORAL_TABLET | Freq: Every day | ORAL | Status: DC
  Administered 2016-11-22: 10 mg via ORAL
  Filled 2016-11-22 (×2): qty 1

## 2016-11-22 MED ORDER — HALOPERIDOL LACTATE 5 MG/ML IJ SOLN
INTRAMUSCULAR | Status: AC
  Filled 2016-11-22: qty 2

## 2016-11-22 MED ORDER — HALOPERIDOL LACTATE 5 MG/ML IJ SOLN
10.0000 mg | Freq: Once | INTRAMUSCULAR | Status: AC
  Administered 2016-11-22: 10 mg via INTRAMUSCULAR

## 2016-11-22 MED ORDER — PRAVASTATIN SODIUM 20 MG PO TABS
20.0000 mg | ORAL_TABLET | Freq: Every day | ORAL | Status: DC
  Administered 2016-11-22: 20 mg via ORAL
  Filled 2016-11-22 (×2): qty 1

## 2016-11-22 MED ORDER — LORAZEPAM 2 MG/ML IJ SOLN: 1.0000 mg | Freq: Once | INTRAMUSCULAR | Status: DC

## 2016-11-22 MED ORDER — LISINOPRIL-HYDROCHLOROTHIAZIDE 10-12.5 MG PO TABS: 1.0000 | ORAL_TABLET | Freq: Every day | ORAL | Status: DC

## 2016-11-22 MED ORDER — HALOPERIDOL LACTATE 5 MG/ML IJ SOLN: 2.0000 mg | Freq: Once | INTRAMUSCULAR | Status: DC

## 2016-11-22 MED ORDER — BENZTROPINE MESYLATE 1 MG PO TABS: 1.0000 mg | ORAL_TABLET | Freq: Every day | ORAL | Status: DC

## 2016-11-22 MED ORDER — LORAZEPAM 0.5 MG PO TABS: 0.5000 mg | ORAL_TABLET | Freq: Two times a day (BID) | ORAL | Status: DC

## 2016-11-22 MED ORDER — DIPHENHYDRAMINE HCL 50 MG/ML IJ SOLN: 25.0000 mg | Freq: Once | INTRAMUSCULAR | Status: DC

## 2016-11-22 MED ORDER — HYDROCHLOROTHIAZIDE 12.5 MG PO CAPS
12.5000 mg | ORAL_CAPSULE | Freq: Every day | ORAL | Status: DC
  Administered 2016-11-22: 12.5 mg via ORAL
  Filled 2016-11-22 (×2): qty 1

## 2016-11-22 MED ORDER — HALOPERIDOL 1 MG PO TABS: 0.5000 mg | ORAL_TABLET | Freq: Two times a day (BID) | ORAL | Status: DC | PRN

## 2016-11-22 MED ORDER — BENZTROPINE MESYLATE 0.5 MG PO TABS
0.5000 mg | ORAL_TABLET | Freq: Two times a day (BID) | ORAL | Status: DC
  Administered 2016-11-22: 0.5 mg via ORAL
  Filled 2016-11-22 (×2): qty 1

## 2016-11-22 MED ORDER — CARBAMAZEPINE 200 MG PO TABS
200.0000 mg | ORAL_TABLET | Freq: Two times a day (BID) | ORAL | Status: DC
Start: 2016-11-22 — End: 2016-11-23
  Administered 2016-11-22: 200 mg via ORAL
  Filled 2016-11-22: qty 1

## 2016-11-22 MED ORDER — HALOPERIDOL 5 MG PO TABS
5.0000 mg | ORAL_TABLET | Freq: Two times a day (BID) | ORAL | Status: DC
  Filled 2016-11-22: qty 1

## 2016-11-22 NOTE — ED Notes (Signed)
Spoke with Joan Nash,  Pt will be transported in AM, due to needed Sheriff's transport because of IVC.  Will pass along placement to next shift.

## 2016-11-22 NOTE — ED Notes (Signed)
IVC PAPERS PRESENT 

## 2016-11-22 NOTE — ED Notes (Signed)
ED Provider at bedside. EDP LOCKWOOD AND TTS PRESENT TO EVALUATE THIS PT. PT COMBATIVE WITH CARE ONLY WHEN TOUCHED. HALLUCINATING. SITTER ORDERED. TTS REVIEWING MEDICATIONS. PT NOT WILLING TO FOLLOW COMMANDS OR ASSIST WITH PT CARE AT THIS TIME.

## 2016-11-22 NOTE — ED Notes (Signed)
SECURITY REQUEST TO CLEAR PT BEFORE TRANSFER TO TCU

## 2016-11-22 NOTE — ED Notes (Signed)
Pt is refusing EKG and Vital signs, when asked if I could get them she began cussing towards me and slammed the door.

## 2016-11-22 NOTE — Progress Notes (Signed)
CSW filed patient's examination and recommendation paperwork into IVC logbook.  Jaiyah Beining, LCSWA [facility]  Clinical Social Worker [phone] 

## 2016-11-22 NOTE — ED Notes (Signed)
SITTER PRESENT. PT REMOVED CLOTHING AND URINE INCONTINENCE PRESENT. PT AGITATED AND COMBATIVE. PT CURSING AT STAFF, KICKING AND REFUSING TO STAY IN BED. EDP LOCKWOOD PRESENT TO EVALUATE PT

## 2016-11-22 NOTE — BH Assessment (Signed)
Pt has been accepted to [facility] per [place]. Pt is IVC'd and will need Sheriff transport. Accepting is Dr. Seth [place]. Call to report [phone]. Pt is able to be transported at any time.   Princess BruinsAquicha Duff, MSW, LCSWA TTS Specialist [phone]

## 2016-11-22 NOTE — ED Notes (Signed)
Per TTS:  Pt meets in-patient criteria, for ger-psych referral.

## 2016-11-22 NOTE — Consult Note (Signed)
[facility]iat[facility] Consult   Reason for Consult:  Psychiatric evaluation Referring Physician:  EDP Patient Identification: Joan Nash MRN:  [id number] Principal Diagnosis: Schizoaffective disorder [facility]) Diagnosis:   Patient Active Problem List   Diagnosis Date Noted  . Schizoaffective disorder ([place]) [F25.9] 01/29/2015    Priority: High  . Dementia with behavioral disturbance [F03.91] 04/12/2016  . Encounter for preadmission testing [Z01.818]     Total Time spent with patient: 45 minutes  Subjective:   Joan Nash is a 71 y.o. female patient admitted with psychosis.  HPI: Patient with history of Paranoid Schizophrenia, dementia and  Depression who was IVC'd by her Niece due psychosis and delusion. Patient is a poor historian but her Niece reports that she has been talking to herself, hearing voices and talking to the voices. Niece reports that patient has been referring to herself by a different name [facility]) and her behavior has generally been bizarre in the last few days.    Past Psychiatric History: as above  Risk to Self: Suicidal Ideation: No Suicidal Intent: No Is patient at risk for suicide?: No Suicidal Plan?: No Access to Means: No What has been your use of drugs/alcohol within the last 12 months?: none How many times?: 0 Other Self Harm Risks: none Triggers for Past Attempts: None known Intentional Self Injurious Behavior: None Risk to Others: Homicidal Ideation: No Thoughts of Harm to Others: No Current Homicidal Intent: No Current Homicidal Plan: No Access to Homicidal Means: No Identified Victim: none History of harm to others?: No Assessment of Violence: None Noted Violent Behavior Description: none noted Does patient have access to weapons?: No Criminal Charges Pending?: No Does patient have a court date: No Prior Inpatient Therapy: Prior Inpatient Therapy: Yes Prior Therapy Dates: 2016 Prior Therapy Facilty/Provider(s): [facility] Reason for Treatment: Gero/psych Prior Outpatient Therapy: Prior Outpatient Therapy: Yes Prior Therapy Dates: current Prior Therapy Facilty/Provider(s): unknown Reason for Treatment: unspecified Does patient have an ACCT team?: Unknown Does patient have Intensive In-House Services?  : Unknown Does patient have [facility] services? : Unknown Does patient have P4CC services?: Unknown  Past Medical History:  Past Medical History:  Diagnosis Date  . Depression   . Diabetes mellitus   . Hypercholesterolemia   . Hypertension   . Schizophrenia [facility])     Past Surgical History:  Procedure Laterality Date  . ABDOMINAL HYSTERECTOMY    . TOOTH EXTRACTION     Family History: History reviewed. No pertinent family history. Family Psychiatric  History:  Social History:  History  Alcohol Use No     History  Drug Use No    Social History   Social History  . Marital status: Single    Spouse name: N/A  . Number of children: N/A  . Years of education: N/A   Social History Main Topics  . Smoking status: Never Smoker  . Smokeless tobacco: Never Used  . Alcohol use No  . Drug use: No  . Sexual activity: Not Asked   Other Topics Concern  . None   Social History Narrative  . None   Additional Social History:    Allergies:   Allergies  Allergen Reactions  . Penicillins Anaphylaxis    Has patient had a PCN reaction causing immediate rash, facial/tongue/throat swelling, SOB or lightheadedness with hypotension: yes Has patient had a PCN reaction causing severe rash involving mucus membranes or skin necrosis: no Has patient had a PCN reaction that required hospitalization yes Has patient had a PCN  reaction occurring within the last 10 years: no If all of the above answers are "NO", then may proceed with Cephalosporin use.     Labs:  Results for orders placed or performed during the hospital encounter of 11/21/16 (from the past 48 hour(s))  Comprehensive metabolic panel      Status: Abnormal   Collection Time: 11/21/16  9:18 PM  Result Value Ref Range   Sodium 142 135 - 145 mmol/L   Potassium 3.7 3.5 - 5.1 mmol/L   Chloride 109 101 - 111 mmol/L   CO2 28 22 - 32 mmol/L   Glucose, Bld 112 (H) 65 - 99 mg/dL   BUN 17 6 - 20 mg/dL   Creatinine, Ser 0.63 0.44 - 1.00 mg/dL   Calcium 9.1 8.9 - 10.3 mg/dL   Total Protein 7.1 6.5 - 8.1 g/dL   Albumin 3.2 (L) 3.5 - 5.0 g/dL   AST 14 (L) 15 - 41 U/L   ALT 10 (L) 14 - 54 U/L   Alkaline Phosphatase 35 (L) 38 - 126 U/L   Total Bilirubin 0.3 0.3 - 1.2 mg/dL   GFR calc non Af Amer >60 >60 mL/min   GFR calc Af Amer >60 >60 mL/min    Comment: (NOTE) The eGFR has been calculated using the CKD EPI equation. This calculation has not been validated in all clinical situations. eGFR's persistently <60 mL/min signify possible Chronic Kidney Disease.    Anion gap 5 5 - 15  Ethanol     Status: None   Collection Time: 11/21/16  9:18 PM  Result Value Ref Range   Alcohol, Ethyl (B) <5 <5 mg/dL    Comment:        LOWEST DETECTABLE LIMIT FOR SERUM ALCOHOL IS 5 mg/dL FOR MEDICAL PURPOSES ONLY   CBC with Diff     Status: Abnormal   Collection Time: 11/21/16  9:18 PM  Result Value Ref Range   WBC 5.1 4.0 - 10.5 K/uL   RBC 4.04 3.87 - 5.11 MIL/uL   Hemoglobin 11.8 (L) 12.0 - 15.0 g/dL   HCT 36.6 36.0 - 46.0 %   MCV 90.6 78.0 - 100.0 fL   MCH 29.2 26.0 - 34.0 pg   MCHC 32.2 30.0 - 36.0 g/dL   RDW 14.8 11.5 - 15.5 %   Platelets 211 150 - 400 K/uL   Neutrophils Relative % 54 %   Neutro Abs 2.8 1.7 - 7.7 K/uL   Lymphocytes Relative 32 %   Lymphs Abs 1.6 0.7 - 4.0 K/uL   Monocytes Relative 12 %   Monocytes Absolute 0.6 0.1 - 1.0 K/uL   Eosinophils Relative 2 %   Eosinophils Absolute 0.1 0.0 - 0.7 K/uL   Basophils Relative 0 %   Basophils Absolute 0.0 0.0 - 0.1 K/uL  Urine rapid drug screen (hosp performed)not at [facility]     Status: None   Collection Time: 11/21/16  9:35 PM  Result Value Ref Range   Opiates NONE DETECTED  NONE DETECTED   Cocaine NONE DETECTED NONE DETECTED   Benzodiazepines NONE DETECTED NONE DETECTED   Amphetamines NONE DETECTED NONE DETECTED   Tetrahydrocannabinol NONE DETECTED NONE DETECTED   Barbiturates NONE DETECTED NONE DETECTED    Comment:        DRUG SCREEN FOR MEDICAL PURPOSES ONLY.  IF CONFIRMATION IS NEEDED FOR ANY PURPOSE, NOTIFY LAB WITHIN 5 DAYS.        LOWEST DETECTABLE LIMITS FOR URINE DRUG SCREEN Drug Class  Cutoff (ng/mL) Amphetamine      1000 Barbiturate      200 Benzodiazepine   409 Tricyclics       811 Opiates          300 Cocaine          300 THC              50     Current Facility-Administered Medications  Medication Dose Route Frequency Provider Last Rate Last Dose  . benztropine (COGENTIN) tablet 0.5 mg  0.5 mg Oral BID Corena Pilgrim, MD      . carbamazepine (TEGRETOL) tablet 200 mg  200 mg Oral BID PC Harvard Zeiss, MD      . FLUoxetine (PROZAC) capsule 20 mg  20 mg Oral Daily Daveena Elmore, MD      . haloperidol (HALDOL) tablet 5 mg  5 mg Oral BID Nancy Arvin, MD      . lisinopril (PRINIVIL,ZESTRIL) tablet 10 mg  10 mg Oral Daily Maeola Mchaney, MD       And  . hydrochlorothiazide (MICROZIDE) capsule 12.5 mg  12.5 mg Oral Daily Dardan Shelton, MD      . LORazepam (ATIVAN) tablet 0.5 mg  0.5 mg Oral BID Valorie Mcgrory, MD      . pravastatin (PRAVACHOL) tablet 20 mg  20 mg Oral Daily Corena Pilgrim, MD       Current Outpatient Prescriptions  Medication Sig Dispense Refill  . benztropine (COGENTIN) 1 MG tablet Take 1 mg by mouth daily.    . clonazePAM (KLONOPIN) 0.5 MG tablet Take 0.5 mg by mouth 2 (two) times daily as needed for anxiety.    . divalproex (DEPAKOTE ER) 500 MG 24 hr tablet Take 500 mg by mouth daily.    Marland Kitchen FLUoxetine (PROZAC) 20 MG capsule Take 20 mg by mouth daily.    . haloperidol (HALDOL) 0.5 MG tablet Take 0.5 mg by mouth 2 (two) times daily as needed for agitation.    . haloperidol decanoate (HALDOL DECANOATE)  50 MG/ML injection Inject 150 mg into the muscle every 28 (twenty-eight) days.    Marland Kitchen lisinopril-hydrochlorothiazide (PRINZIDE,ZESTORETIC) 10-12.5 MG tablet Take 1 tablet by mouth daily.    . pravastatin (PRAVACHOL) 20 MG tablet Take 20 mg by mouth daily.      Musculoskeletal: Strength & Muscle Tone: within normal limits Gait & Station: unsteady Patient leans: N/A  Psychiatric Specialty Exam: Physical Exam  Psychiatric: Her affect is labile. Her speech is delayed, tangential and slurred. She is agitated and actively hallucinating. Thought content is paranoid. Cognition and memory are impaired. She expresses impulsivity.    Review of Systems  Constitutional: Negative.   HENT: Negative.   Eyes: Negative.   Respiratory: Negative.   Cardiovascular: Negative.   Gastrointestinal: Negative.   Genitourinary: Negative.   Musculoskeletal: Negative.   Skin: Negative.   Neurological: Negative.   Endo/Heme/Allergies: Negative.   Psychiatric/Behavioral: Positive for hallucinations and memory loss. The patient is nervous/anxious.     Blood pressure 123/73, pulse 69, temperature 98.5 F (36.9 C), temperature source Oral, resp. rate 16, height '5\' 7"'$  (1.702 m), weight 96.2 kg (212 lb), SpO2 95 %.Body mass index is 33.2 kg/m.  General Appearance: Bizarre  Eye Contact:  Minimal  Speech:  Slow  Volume:  Decreased  Mood:  Irritable  Affect: consticted  Thought Process:  Disorganized  Orientation:  Other:  disoriented  Thought Content:  Illogical, Delusions and Hallucinations: Auditory  Suicidal Thoughts:  No  Homicidal Thoughts:  No  Memory:  poor  Judgement:  Poor  Insight:  Lacking  Psychomotor Activity:  Psychomotor Retardation  Concentration:  Concentration: Fair and Attention Span: Fair  Recall:  Poor  Fund of Knowledge:  Poor  Language:  Fair  Akathisia:  No  Handed:  Right  AIMS (if indicated):     Assets:  Social Support  ADL's:  Impaired  Cognition:  Impaired,  Mild  Sleep:    poor     Treatment Plan Summary: Daily contact with patient to assess and evaluate symptoms and progress in treatment and Medication management  Continue  Haldol 5 mg bid for psychosis Start Tegretol 200 mg bid for mood Cogentin 0.5 mg bid for EPS prevention Discontinue Depakote Disposition: Recommend psychiatric Inpatient admission when medically cleared.  Corena Pilgrim, MD 11/22/2016 12:13 PM

## 2016-11-22 NOTE — ED Provider Notes (Signed)
Patient agitated, combative, urinary on the floor, trying to get out of bed, sitting at staff members. Patient seems to be responding to external stimuli, is not agreeable, not responding to calm instructions by nursing staff. Additional orders provided by our psychiatry colleagues, for oral medication, are not tolerated. Patient will require intramuscular sedation for safety, further evaluation.  Security aware, restraining the patient.  Update: Patient substantially more comfortable, resting after medication.    Joan Munchobert [name], MD 11/22/16 [phone]

## 2016-11-23 ENCOUNTER — Emergency Department (HOSPITAL_COMMUNITY): Payer: Medicare Other

## 2016-11-23 DIAGNOSIS — R1312 Dysphagia, oropharyngeal phase: Secondary | ICD-10-CM | POA: Diagnosis not present

## 2016-11-23 DIAGNOSIS — E119 Type 2 diabetes mellitus without complications: Secondary | ICD-10-CM | POA: Diagnosis not present

## 2016-11-23 DIAGNOSIS — B9620 Unspecified Escherichia coli [E. coli] as the cause of diseases classified elsewhere: Secondary | ICD-10-CM | POA: Diagnosis not present

## 2016-11-23 DIAGNOSIS — F209 Schizophrenia, unspecified: Secondary | ICD-10-CM | POA: Diagnosis present

## 2016-11-23 DIAGNOSIS — K5901 Slow transit constipation: Secondary | ICD-10-CM | POA: Diagnosis not present

## 2016-11-23 DIAGNOSIS — I10 Essential (primary) hypertension: Secondary | ICD-10-CM | POA: Diagnosis present

## 2016-11-23 DIAGNOSIS — E785 Hyperlipidemia, unspecified: Secondary | ICD-10-CM | POA: Diagnosis not present

## 2016-11-23 DIAGNOSIS — Z79899 Other long term (current) drug therapy: Secondary | ICD-10-CM | POA: Diagnosis not present

## 2016-11-23 DIAGNOSIS — E1165 Type 2 diabetes mellitus with hyperglycemia: Secondary | ICD-10-CM | POA: Diagnosis not present

## 2016-11-23 DIAGNOSIS — R1310 Dysphagia, unspecified: Secondary | ICD-10-CM | POA: Diagnosis not present

## 2016-11-23 DIAGNOSIS — R1311 Dysphagia, oral phase: Secondary | ICD-10-CM | POA: Diagnosis present

## 2016-11-23 DIAGNOSIS — E782 Mixed hyperlipidemia: Secondary | ICD-10-CM | POA: Diagnosis not present

## 2016-11-23 DIAGNOSIS — E559 Vitamin D deficiency, unspecified: Secondary | ICD-10-CM | POA: Diagnosis not present

## 2016-11-23 DIAGNOSIS — F200 Paranoid schizophrenia: Secondary | ICD-10-CM | POA: Diagnosis not present

## 2016-11-23 DIAGNOSIS — Z23 Encounter for immunization: Secondary | ICD-10-CM | POA: Diagnosis not present

## 2016-11-23 DIAGNOSIS — F0390 Unspecified dementia without behavioral disturbance: Secondary | ICD-10-CM | POA: Diagnosis not present

## 2016-11-23 DIAGNOSIS — N3000 Acute cystitis without hematuria: Secondary | ICD-10-CM | POA: Diagnosis not present

## 2016-11-23 DIAGNOSIS — R531 Weakness: Secondary | ICD-10-CM | POA: Diagnosis not present

## 2016-11-23 DIAGNOSIS — E118 Type 2 diabetes mellitus with unspecified complications: Secondary | ICD-10-CM | POA: Diagnosis not present

## 2016-11-23 DIAGNOSIS — Z880 Allergy status to penicillin: Secondary | ICD-10-CM | POA: Diagnosis not present

## 2016-11-23 DIAGNOSIS — R4182 Altered mental status, unspecified: Secondary | ICD-10-CM | POA: Diagnosis not present

## 2016-11-23 DIAGNOSIS — N390 Urinary tract infection, site not specified: Secondary | ICD-10-CM | POA: Diagnosis not present

## 2016-11-23 DIAGNOSIS — F329 Major depressive disorder, single episode, unspecified: Secondary | ICD-10-CM | POA: Diagnosis not present

## 2016-11-23 NOTE — ED Notes (Signed)
Contacted Sheriff and left message stating that pt was IVC and needed to be transported to [facility] anytime after 7am; [place] contacted to confirm placement; instructed to have report called after 7am

## 2016-11-23 NOTE — ED Notes (Addendum)
Pt followed directions when this nurse replaced her socks; pt disoriented to time, place, situation, and self; when asked if her name was Lanora Manislizabeth, pt stated "No"

## 2016-11-23 NOTE — ED Notes (Signed)
Pt ambulated to restroom with minimal assistance.

## 2016-11-23 NOTE — ED Notes (Signed)
Report called to [facility], Grace Faircloth RN.

## 2016-12-16 DIAGNOSIS — Z9183 Wandering in diseases classified elsewhere: Secondary | ICD-10-CM | POA: Diagnosis not present

## 2016-12-16 DIAGNOSIS — F0391 Unspecified dementia with behavioral disturbance: Secondary | ICD-10-CM | POA: Diagnosis not present

## 2016-12-16 DIAGNOSIS — E119 Type 2 diabetes mellitus without complications: Secondary | ICD-10-CM | POA: Diagnosis not present

## 2016-12-16 DIAGNOSIS — F329 Major depressive disorder, single episode, unspecified: Secondary | ICD-10-CM | POA: Diagnosis not present

## 2016-12-16 DIAGNOSIS — F209 Schizophrenia, unspecified: Secondary | ICD-10-CM | POA: Diagnosis not present

## 2016-12-16 DIAGNOSIS — F419 Anxiety disorder, unspecified: Secondary | ICD-10-CM | POA: Diagnosis not present

## 2016-12-21 DIAGNOSIS — F339 Major depressive disorder, recurrent, unspecified: Secondary | ICD-10-CM | POA: Diagnosis not present

## 2016-12-21 DIAGNOSIS — E785 Hyperlipidemia, unspecified: Secondary | ICD-10-CM | POA: Diagnosis not present

## 2016-12-21 DIAGNOSIS — I10 Essential (primary) hypertension: Secondary | ICD-10-CM | POA: Diagnosis not present

## 2016-12-21 DIAGNOSIS — E119 Type 2 diabetes mellitus without complications: Secondary | ICD-10-CM | POA: Diagnosis not present

## 2016-12-21 DIAGNOSIS — F209 Schizophrenia, unspecified: Secondary | ICD-10-CM | POA: Diagnosis not present

## 2016-12-21 DIAGNOSIS — F0281 Dementia in other diseases classified elsewhere with behavioral disturbance: Secondary | ICD-10-CM | POA: Diagnosis not present

## 2016-12-29 DIAGNOSIS — F0391 Unspecified dementia with behavioral disturbance: Secondary | ICD-10-CM | POA: Diagnosis not present

## 2016-12-29 DIAGNOSIS — F29 Unspecified psychosis not due to a substance or known physiological condition: Secondary | ICD-10-CM | POA: Diagnosis not present

## 2016-12-29 DIAGNOSIS — F321 Major depressive disorder, single episode, moderate: Secondary | ICD-10-CM | POA: Diagnosis not present

## 2016-12-29 DIAGNOSIS — F418 Other specified anxiety disorders: Secondary | ICD-10-CM | POA: Diagnosis not present

## 2016-12-31 DIAGNOSIS — E119 Type 2 diabetes mellitus without complications: Secondary | ICD-10-CM | POA: Diagnosis not present

## 2016-12-31 DIAGNOSIS — F209 Schizophrenia, unspecified: Secondary | ICD-10-CM | POA: Diagnosis not present

## 2016-12-31 DIAGNOSIS — F0391 Unspecified dementia with behavioral disturbance: Secondary | ICD-10-CM | POA: Diagnosis not present

## 2016-12-31 DIAGNOSIS — F329 Major depressive disorder, single episode, unspecified: Secondary | ICD-10-CM | POA: Diagnosis not present

## 2016-12-31 DIAGNOSIS — F419 Anxiety disorder, unspecified: Secondary | ICD-10-CM | POA: Diagnosis not present

## 2016-12-31 DIAGNOSIS — Z9183 Wandering in diseases classified elsewhere: Secondary | ICD-10-CM | POA: Diagnosis not present

## 2017-01-27 DIAGNOSIS — F209 Schizophrenia, unspecified: Secondary | ICD-10-CM | POA: Diagnosis not present

## 2017-01-27 DIAGNOSIS — F329 Major depressive disorder, single episode, unspecified: Secondary | ICD-10-CM | POA: Diagnosis not present

## 2017-01-27 DIAGNOSIS — Z9183 Wandering in diseases classified elsewhere: Secondary | ICD-10-CM | POA: Diagnosis not present

## 2017-01-27 DIAGNOSIS — F419 Anxiety disorder, unspecified: Secondary | ICD-10-CM | POA: Diagnosis not present

## 2017-01-27 DIAGNOSIS — E119 Type 2 diabetes mellitus without complications: Secondary | ICD-10-CM | POA: Diagnosis not present

## 2017-01-27 DIAGNOSIS — F0391 Unspecified dementia with behavioral disturbance: Secondary | ICD-10-CM | POA: Diagnosis not present

## 2017-01-29 DIAGNOSIS — F419 Anxiety disorder, unspecified: Secondary | ICD-10-CM | POA: Diagnosis not present

## 2017-01-29 DIAGNOSIS — Z9183 Wandering in diseases classified elsewhere: Secondary | ICD-10-CM | POA: Diagnosis not present

## 2017-01-29 DIAGNOSIS — E119 Type 2 diabetes mellitus without complications: Secondary | ICD-10-CM | POA: Diagnosis not present

## 2017-01-29 DIAGNOSIS — F209 Schizophrenia, unspecified: Secondary | ICD-10-CM | POA: Diagnosis not present

## 2017-01-29 DIAGNOSIS — F329 Major depressive disorder, single episode, unspecified: Secondary | ICD-10-CM | POA: Diagnosis not present

## 2017-01-29 DIAGNOSIS — F0391 Unspecified dementia with behavioral disturbance: Secondary | ICD-10-CM | POA: Diagnosis not present

## 2017-02-02 DIAGNOSIS — F321 Major depressive disorder, single episode, moderate: Secondary | ICD-10-CM | POA: Diagnosis not present

## 2017-02-02 DIAGNOSIS — F418 Other specified anxiety disorders: Secondary | ICD-10-CM | POA: Diagnosis not present

## 2017-02-02 DIAGNOSIS — F0391 Unspecified dementia with behavioral disturbance: Secondary | ICD-10-CM | POA: Diagnosis not present

## 2017-02-02 DIAGNOSIS — F29 Unspecified psychosis not due to a substance or known physiological condition: Secondary | ICD-10-CM | POA: Diagnosis not present

## 2017-02-10 DIAGNOSIS — Z79899 Other long term (current) drug therapy: Secondary | ICD-10-CM | POA: Diagnosis not present

## 2017-02-10 DIAGNOSIS — E119 Type 2 diabetes mellitus without complications: Secondary | ICD-10-CM | POA: Diagnosis not present

## 2017-02-10 DIAGNOSIS — R569 Unspecified convulsions: Secondary | ICD-10-CM | POA: Diagnosis not present

## 2017-02-10 DIAGNOSIS — E785 Hyperlipidemia, unspecified: Secondary | ICD-10-CM | POA: Diagnosis not present

## 2017-02-11 DIAGNOSIS — F209 Schizophrenia, unspecified: Secondary | ICD-10-CM | POA: Diagnosis not present

## 2017-02-11 DIAGNOSIS — E119 Type 2 diabetes mellitus without complications: Secondary | ICD-10-CM | POA: Diagnosis not present

## 2017-02-11 DIAGNOSIS — F419 Anxiety disorder, unspecified: Secondary | ICD-10-CM | POA: Diagnosis not present

## 2017-02-11 DIAGNOSIS — F0391 Unspecified dementia with behavioral disturbance: Secondary | ICD-10-CM | POA: Diagnosis not present

## 2017-02-11 DIAGNOSIS — F329 Major depressive disorder, single episode, unspecified: Secondary | ICD-10-CM | POA: Diagnosis not present

## 2017-02-11 DIAGNOSIS — Z9183 Wandering in diseases classified elsewhere: Secondary | ICD-10-CM | POA: Diagnosis not present

## 2017-02-14 DIAGNOSIS — F419 Anxiety disorder, unspecified: Secondary | ICD-10-CM | POA: Diagnosis not present

## 2017-02-14 DIAGNOSIS — F209 Schizophrenia, unspecified: Secondary | ICD-10-CM | POA: Diagnosis not present

## 2017-02-14 DIAGNOSIS — E119 Type 2 diabetes mellitus without complications: Secondary | ICD-10-CM | POA: Diagnosis not present

## 2017-02-14 DIAGNOSIS — F329 Major depressive disorder, single episode, unspecified: Secondary | ICD-10-CM | POA: Diagnosis not present

## 2017-02-14 DIAGNOSIS — F0391 Unspecified dementia with behavioral disturbance: Secondary | ICD-10-CM | POA: Diagnosis not present

## 2017-02-14 DIAGNOSIS — Z9183 Wandering in diseases classified elsewhere: Secondary | ICD-10-CM | POA: Diagnosis not present

## 2017-02-25 DIAGNOSIS — H25013 Cortical age-related cataract, bilateral: Secondary | ICD-10-CM | POA: Diagnosis not present

## 2017-02-25 DIAGNOSIS — H2513 Age-related nuclear cataract, bilateral: Secondary | ICD-10-CM | POA: Diagnosis not present

## 2017-02-26 DIAGNOSIS — F0391 Unspecified dementia with behavioral disturbance: Secondary | ICD-10-CM | POA: Diagnosis not present

## 2017-02-26 DIAGNOSIS — F329 Major depressive disorder, single episode, unspecified: Secondary | ICD-10-CM | POA: Diagnosis not present

## 2017-02-26 DIAGNOSIS — E119 Type 2 diabetes mellitus without complications: Secondary | ICD-10-CM | POA: Diagnosis not present

## 2017-02-26 DIAGNOSIS — Z9183 Wandering in diseases classified elsewhere: Secondary | ICD-10-CM | POA: Diagnosis not present

## 2017-02-26 DIAGNOSIS — F209 Schizophrenia, unspecified: Secondary | ICD-10-CM | POA: Diagnosis not present

## 2017-02-26 DIAGNOSIS — F419 Anxiety disorder, unspecified: Secondary | ICD-10-CM | POA: Diagnosis not present

## 2017-03-02 DIAGNOSIS — F321 Major depressive disorder, single episode, moderate: Secondary | ICD-10-CM | POA: Diagnosis not present

## 2017-03-02 DIAGNOSIS — F29 Unspecified psychosis not due to a substance or known physiological condition: Secondary | ICD-10-CM | POA: Diagnosis not present

## 2017-03-02 DIAGNOSIS — F418 Other specified anxiety disorders: Secondary | ICD-10-CM | POA: Diagnosis not present

## 2017-03-02 DIAGNOSIS — F0391 Unspecified dementia with behavioral disturbance: Secondary | ICD-10-CM | POA: Diagnosis not present

## 2017-03-26 DIAGNOSIS — F209 Schizophrenia, unspecified: Secondary | ICD-10-CM | POA: Diagnosis not present

## 2017-03-26 DIAGNOSIS — F329 Major depressive disorder, single episode, unspecified: Secondary | ICD-10-CM | POA: Diagnosis not present

## 2017-03-26 DIAGNOSIS — F419 Anxiety disorder, unspecified: Secondary | ICD-10-CM | POA: Diagnosis not present

## 2017-03-26 DIAGNOSIS — F0391 Unspecified dementia with behavioral disturbance: Secondary | ICD-10-CM | POA: Diagnosis not present

## 2017-03-26 DIAGNOSIS — E119 Type 2 diabetes mellitus without complications: Secondary | ICD-10-CM | POA: Diagnosis not present

## 2017-03-26 DIAGNOSIS — Z9183 Wandering in diseases classified elsewhere: Secondary | ICD-10-CM | POA: Diagnosis not present

## 2017-03-29 DIAGNOSIS — F209 Schizophrenia, unspecified: Secondary | ICD-10-CM | POA: Diagnosis not present

## 2017-03-29 DIAGNOSIS — I10 Essential (primary) hypertension: Secondary | ICD-10-CM | POA: Diagnosis not present

## 2017-03-29 DIAGNOSIS — E119 Type 2 diabetes mellitus without complications: Secondary | ICD-10-CM | POA: Diagnosis not present

## 2017-03-29 DIAGNOSIS — F0281 Dementia in other diseases classified elsewhere with behavioral disturbance: Secondary | ICD-10-CM | POA: Diagnosis not present

## 2017-04-06 DIAGNOSIS — F29 Unspecified psychosis not due to a substance or known physiological condition: Secondary | ICD-10-CM | POA: Diagnosis not present

## 2017-04-06 DIAGNOSIS — F418 Other specified anxiety disorders: Secondary | ICD-10-CM | POA: Diagnosis not present

## 2017-04-06 DIAGNOSIS — F321 Major depressive disorder, single episode, moderate: Secondary | ICD-10-CM | POA: Diagnosis not present

## 2017-04-06 DIAGNOSIS — F0391 Unspecified dementia with behavioral disturbance: Secondary | ICD-10-CM | POA: Diagnosis not present

## 2017-04-12 ENCOUNTER — Other Ambulatory Visit: Payer: Self-pay

## 2017-04-12 DIAGNOSIS — F0391 Unspecified dementia with behavioral disturbance: Secondary | ICD-10-CM | POA: Diagnosis not present

## 2017-04-12 DIAGNOSIS — F419 Anxiety disorder, unspecified: Secondary | ICD-10-CM | POA: Diagnosis not present

## 2017-04-12 DIAGNOSIS — Z9183 Wandering in diseases classified elsewhere: Secondary | ICD-10-CM | POA: Diagnosis not present

## 2017-04-12 DIAGNOSIS — F329 Major depressive disorder, single episode, unspecified: Secondary | ICD-10-CM | POA: Diagnosis not present

## 2017-04-12 DIAGNOSIS — E119 Type 2 diabetes mellitus without complications: Secondary | ICD-10-CM | POA: Diagnosis not present

## 2017-04-12 DIAGNOSIS — F209 Schizophrenia, unspecified: Secondary | ICD-10-CM | POA: Diagnosis not present

## 2017-04-15 DIAGNOSIS — F329 Major depressive disorder, single episode, unspecified: Secondary | ICD-10-CM | POA: Diagnosis not present

## 2017-04-15 DIAGNOSIS — F0391 Unspecified dementia with behavioral disturbance: Secondary | ICD-10-CM | POA: Diagnosis not present

## 2017-04-15 DIAGNOSIS — F209 Schizophrenia, unspecified: Secondary | ICD-10-CM | POA: Diagnosis not present

## 2017-04-15 DIAGNOSIS — E119 Type 2 diabetes mellitus without complications: Secondary | ICD-10-CM | POA: Diagnosis not present

## 2017-04-15 DIAGNOSIS — Z9183 Wandering in diseases classified elsewhere: Secondary | ICD-10-CM | POA: Diagnosis not present

## 2017-04-15 DIAGNOSIS — F419 Anxiety disorder, unspecified: Secondary | ICD-10-CM | POA: Diagnosis not present

## 2017-04-26 DIAGNOSIS — B351 Tinea unguium: Secondary | ICD-10-CM | POA: Diagnosis not present

## 2017-04-26 DIAGNOSIS — M79675 Pain in left toe(s): Secondary | ICD-10-CM | POA: Diagnosis not present

## 2017-04-26 DIAGNOSIS — M79674 Pain in right toe(s): Secondary | ICD-10-CM | POA: Diagnosis not present

## 2017-05-11 DIAGNOSIS — F29 Unspecified psychosis not due to a substance or known physiological condition: Secondary | ICD-10-CM | POA: Diagnosis not present

## 2017-05-11 DIAGNOSIS — F321 Major depressive disorder, single episode, moderate: Secondary | ICD-10-CM | POA: Diagnosis not present

## 2017-05-11 DIAGNOSIS — F0391 Unspecified dementia with behavioral disturbance: Secondary | ICD-10-CM | POA: Diagnosis not present

## 2017-05-11 DIAGNOSIS — F418 Other specified anxiety disorders: Secondary | ICD-10-CM | POA: Diagnosis not present

## 2017-05-25 DIAGNOSIS — E119 Type 2 diabetes mellitus without complications: Secondary | ICD-10-CM | POA: Diagnosis not present

## 2017-05-25 DIAGNOSIS — F0391 Unspecified dementia with behavioral disturbance: Secondary | ICD-10-CM | POA: Diagnosis not present

## 2017-05-25 DIAGNOSIS — F209 Schizophrenia, unspecified: Secondary | ICD-10-CM | POA: Diagnosis not present

## 2017-05-25 DIAGNOSIS — Z9183 Wandering in diseases classified elsewhere: Secondary | ICD-10-CM | POA: Diagnosis not present

## 2017-05-25 DIAGNOSIS — F329 Major depressive disorder, single episode, unspecified: Secondary | ICD-10-CM | POA: Diagnosis not present

## 2017-05-25 DIAGNOSIS — F419 Anxiety disorder, unspecified: Secondary | ICD-10-CM | POA: Diagnosis not present

## 2017-06-10 DIAGNOSIS — E119 Type 2 diabetes mellitus without complications: Secondary | ICD-10-CM | POA: Diagnosis not present

## 2017-06-10 DIAGNOSIS — F419 Anxiety disorder, unspecified: Secondary | ICD-10-CM | POA: Diagnosis not present

## 2017-06-10 DIAGNOSIS — F209 Schizophrenia, unspecified: Secondary | ICD-10-CM | POA: Diagnosis not present

## 2017-06-10 DIAGNOSIS — Z9183 Wandering in diseases classified elsewhere: Secondary | ICD-10-CM | POA: Diagnosis not present

## 2017-06-10 DIAGNOSIS — F329 Major depressive disorder, single episode, unspecified: Secondary | ICD-10-CM | POA: Diagnosis not present

## 2017-06-10 DIAGNOSIS — F0391 Unspecified dementia with behavioral disturbance: Secondary | ICD-10-CM | POA: Diagnosis not present

## 2017-06-14 DIAGNOSIS — F0391 Unspecified dementia with behavioral disturbance: Secondary | ICD-10-CM | POA: Diagnosis not present

## 2017-06-14 DIAGNOSIS — F329 Major depressive disorder, single episode, unspecified: Secondary | ICD-10-CM | POA: Diagnosis not present

## 2017-06-14 DIAGNOSIS — Z9183 Wandering in diseases classified elsewhere: Secondary | ICD-10-CM | POA: Diagnosis not present

## 2017-06-14 DIAGNOSIS — F419 Anxiety disorder, unspecified: Secondary | ICD-10-CM | POA: Diagnosis not present

## 2017-06-14 DIAGNOSIS — E119 Type 2 diabetes mellitus without complications: Secondary | ICD-10-CM | POA: Diagnosis not present

## 2017-06-14 DIAGNOSIS — F209 Schizophrenia, unspecified: Secondary | ICD-10-CM | POA: Diagnosis not present

## 2017-06-21 DIAGNOSIS — Z9183 Wandering in diseases classified elsewhere: Secondary | ICD-10-CM | POA: Diagnosis not present

## 2017-06-21 DIAGNOSIS — F419 Anxiety disorder, unspecified: Secondary | ICD-10-CM | POA: Diagnosis not present

## 2017-06-21 DIAGNOSIS — F0391 Unspecified dementia with behavioral disturbance: Secondary | ICD-10-CM | POA: Diagnosis not present

## 2017-06-21 DIAGNOSIS — F209 Schizophrenia, unspecified: Secondary | ICD-10-CM | POA: Diagnosis not present

## 2017-06-21 DIAGNOSIS — E119 Type 2 diabetes mellitus without complications: Secondary | ICD-10-CM | POA: Diagnosis not present

## 2017-06-21 DIAGNOSIS — F329 Major depressive disorder, single episode, unspecified: Secondary | ICD-10-CM | POA: Diagnosis not present

## 2017-07-13 DIAGNOSIS — F321 Major depressive disorder, single episode, moderate: Secondary | ICD-10-CM | POA: Diagnosis not present

## 2017-07-13 DIAGNOSIS — F418 Other specified anxiety disorders: Secondary | ICD-10-CM | POA: Diagnosis not present

## 2017-07-13 DIAGNOSIS — F29 Unspecified psychosis not due to a substance or known physiological condition: Secondary | ICD-10-CM | POA: Diagnosis not present

## 2017-07-13 DIAGNOSIS — F0391 Unspecified dementia with behavioral disturbance: Secondary | ICD-10-CM | POA: Diagnosis not present

## 2017-07-19 DIAGNOSIS — F329 Major depressive disorder, single episode, unspecified: Secondary | ICD-10-CM | POA: Diagnosis not present

## 2017-07-19 DIAGNOSIS — E119 Type 2 diabetes mellitus without complications: Secondary | ICD-10-CM | POA: Diagnosis not present

## 2017-07-19 DIAGNOSIS — Z9183 Wandering in diseases classified elsewhere: Secondary | ICD-10-CM | POA: Diagnosis not present

## 2017-07-19 DIAGNOSIS — F419 Anxiety disorder, unspecified: Secondary | ICD-10-CM | POA: Diagnosis not present

## 2017-07-19 DIAGNOSIS — F209 Schizophrenia, unspecified: Secondary | ICD-10-CM | POA: Diagnosis not present

## 2017-07-19 DIAGNOSIS — F0391 Unspecified dementia with behavioral disturbance: Secondary | ICD-10-CM | POA: Diagnosis not present

## 2017-08-10 DIAGNOSIS — M79675 Pain in left toe(s): Secondary | ICD-10-CM | POA: Diagnosis not present

## 2017-08-10 DIAGNOSIS — M79674 Pain in right toe(s): Secondary | ICD-10-CM | POA: Diagnosis not present

## 2017-08-10 DIAGNOSIS — B351 Tinea unguium: Secondary | ICD-10-CM | POA: Diagnosis not present

## 2017-08-12 DIAGNOSIS — E119 Type 2 diabetes mellitus without complications: Secondary | ICD-10-CM | POA: Diagnosis not present

## 2017-08-12 DIAGNOSIS — F419 Anxiety disorder, unspecified: Secondary | ICD-10-CM | POA: Diagnosis not present

## 2017-08-12 DIAGNOSIS — F329 Major depressive disorder, single episode, unspecified: Secondary | ICD-10-CM | POA: Diagnosis not present

## 2017-08-12 DIAGNOSIS — Z23 Encounter for immunization: Secondary | ICD-10-CM | POA: Diagnosis not present

## 2017-08-12 DIAGNOSIS — F0391 Unspecified dementia with behavioral disturbance: Secondary | ICD-10-CM | POA: Diagnosis not present

## 2017-08-12 DIAGNOSIS — Z9183 Wandering in diseases classified elsewhere: Secondary | ICD-10-CM | POA: Diagnosis not present

## 2017-08-12 DIAGNOSIS — F209 Schizophrenia, unspecified: Secondary | ICD-10-CM | POA: Diagnosis not present

## 2017-08-13 DIAGNOSIS — F419 Anxiety disorder, unspecified: Secondary | ICD-10-CM | POA: Diagnosis not present

## 2017-08-13 DIAGNOSIS — Z9183 Wandering in diseases classified elsewhere: Secondary | ICD-10-CM | POA: Diagnosis not present

## 2017-08-13 DIAGNOSIS — E119 Type 2 diabetes mellitus without complications: Secondary | ICD-10-CM | POA: Diagnosis not present

## 2017-08-13 DIAGNOSIS — F209 Schizophrenia, unspecified: Secondary | ICD-10-CM | POA: Diagnosis not present

## 2017-08-13 DIAGNOSIS — F329 Major depressive disorder, single episode, unspecified: Secondary | ICD-10-CM | POA: Diagnosis not present

## 2017-08-13 DIAGNOSIS — F0391 Unspecified dementia with behavioral disturbance: Secondary | ICD-10-CM | POA: Diagnosis not present

## 2017-08-17 DIAGNOSIS — E119 Type 2 diabetes mellitus without complications: Secondary | ICD-10-CM | POA: Diagnosis not present

## 2017-08-17 DIAGNOSIS — F209 Schizophrenia, unspecified: Secondary | ICD-10-CM | POA: Diagnosis not present

## 2017-08-17 DIAGNOSIS — F0391 Unspecified dementia with behavioral disturbance: Secondary | ICD-10-CM | POA: Diagnosis not present

## 2017-08-17 DIAGNOSIS — Z9183 Wandering in diseases classified elsewhere: Secondary | ICD-10-CM | POA: Diagnosis not present

## 2017-08-17 DIAGNOSIS — F419 Anxiety disorder, unspecified: Secondary | ICD-10-CM | POA: Diagnosis not present

## 2017-08-17 DIAGNOSIS — F329 Major depressive disorder, single episode, unspecified: Secondary | ICD-10-CM | POA: Diagnosis not present

## 2017-09-06 DIAGNOSIS — F321 Major depressive disorder, single episode, moderate: Secondary | ICD-10-CM | POA: Diagnosis not present

## 2017-09-06 DIAGNOSIS — F0391 Unspecified dementia with behavioral disturbance: Secondary | ICD-10-CM | POA: Diagnosis not present

## 2017-09-06 DIAGNOSIS — F29 Unspecified psychosis not due to a substance or known physiological condition: Secondary | ICD-10-CM | POA: Diagnosis not present

## 2017-09-06 DIAGNOSIS — F418 Other specified anxiety disorders: Secondary | ICD-10-CM | POA: Diagnosis not present

## 2017-09-13 DIAGNOSIS — F329 Major depressive disorder, single episode, unspecified: Secondary | ICD-10-CM | POA: Diagnosis not present

## 2017-09-13 DIAGNOSIS — Z9183 Wandering in diseases classified elsewhere: Secondary | ICD-10-CM | POA: Diagnosis not present

## 2017-09-13 DIAGNOSIS — E119 Type 2 diabetes mellitus without complications: Secondary | ICD-10-CM | POA: Diagnosis not present

## 2017-09-13 DIAGNOSIS — F419 Anxiety disorder, unspecified: Secondary | ICD-10-CM | POA: Diagnosis not present

## 2017-09-13 DIAGNOSIS — F0391 Unspecified dementia with behavioral disturbance: Secondary | ICD-10-CM | POA: Diagnosis not present

## 2017-09-13 DIAGNOSIS — F209 Schizophrenia, unspecified: Secondary | ICD-10-CM | POA: Diagnosis not present

## 2017-09-22 DIAGNOSIS — E119 Type 2 diabetes mellitus without complications: Secondary | ICD-10-CM | POA: Diagnosis not present

## 2017-09-22 DIAGNOSIS — E785 Hyperlipidemia, unspecified: Secondary | ICD-10-CM | POA: Diagnosis not present

## 2017-09-22 DIAGNOSIS — D649 Anemia, unspecified: Secondary | ICD-10-CM | POA: Diagnosis not present

## 2017-09-22 DIAGNOSIS — Z5181 Encounter for therapeutic drug level monitoring: Secondary | ICD-10-CM | POA: Diagnosis not present

## 2017-10-11 DIAGNOSIS — Z9183 Wandering in diseases classified elsewhere: Secondary | ICD-10-CM | POA: Diagnosis not present

## 2017-10-11 DIAGNOSIS — F0391 Unspecified dementia with behavioral disturbance: Secondary | ICD-10-CM | POA: Diagnosis not present

## 2017-10-11 DIAGNOSIS — F329 Major depressive disorder, single episode, unspecified: Secondary | ICD-10-CM | POA: Diagnosis not present

## 2017-10-11 DIAGNOSIS — F209 Schizophrenia, unspecified: Secondary | ICD-10-CM | POA: Diagnosis not present

## 2017-10-11 DIAGNOSIS — F419 Anxiety disorder, unspecified: Secondary | ICD-10-CM | POA: Diagnosis not present

## 2017-10-11 DIAGNOSIS — E119 Type 2 diabetes mellitus without complications: Secondary | ICD-10-CM | POA: Diagnosis not present

## 2017-10-12 DIAGNOSIS — E119 Type 2 diabetes mellitus without complications: Secondary | ICD-10-CM | POA: Diagnosis not present

## 2017-10-12 DIAGNOSIS — F0391 Unspecified dementia with behavioral disturbance: Secondary | ICD-10-CM | POA: Diagnosis not present

## 2017-10-12 DIAGNOSIS — F419 Anxiety disorder, unspecified: Secondary | ICD-10-CM | POA: Diagnosis not present

## 2017-10-12 DIAGNOSIS — F209 Schizophrenia, unspecified: Secondary | ICD-10-CM | POA: Diagnosis not present

## 2017-10-12 DIAGNOSIS — F329 Major depressive disorder, single episode, unspecified: Secondary | ICD-10-CM | POA: Diagnosis not present

## 2017-10-12 DIAGNOSIS — Z9183 Wandering in diseases classified elsewhere: Secondary | ICD-10-CM | POA: Diagnosis not present

## 2017-10-13 DIAGNOSIS — H2513 Age-related nuclear cataract, bilateral: Secondary | ICD-10-CM | POA: Diagnosis not present

## 2017-10-13 DIAGNOSIS — H2589 Other age-related cataract: Secondary | ICD-10-CM | POA: Diagnosis not present

## 2017-10-13 DIAGNOSIS — H25013 Cortical age-related cataract, bilateral: Secondary | ICD-10-CM | POA: Diagnosis not present

## 2017-10-19 DIAGNOSIS — F321 Major depressive disorder, single episode, moderate: Secondary | ICD-10-CM | POA: Diagnosis not present

## 2017-10-19 DIAGNOSIS — F0391 Unspecified dementia with behavioral disturbance: Secondary | ICD-10-CM | POA: Diagnosis not present

## 2017-10-19 DIAGNOSIS — F418 Other specified anxiety disorders: Secondary | ICD-10-CM | POA: Diagnosis not present

## 2017-10-19 DIAGNOSIS — F29 Unspecified psychosis not due to a substance or known physiological condition: Secondary | ICD-10-CM | POA: Diagnosis not present

## 2017-11-02 DIAGNOSIS — F418 Other specified anxiety disorders: Secondary | ICD-10-CM | POA: Diagnosis not present

## 2017-11-02 DIAGNOSIS — F329 Major depressive disorder, single episode, unspecified: Secondary | ICD-10-CM | POA: Diagnosis not present

## 2017-11-02 DIAGNOSIS — F0391 Unspecified dementia with behavioral disturbance: Secondary | ICD-10-CM | POA: Diagnosis not present

## 2017-11-02 DIAGNOSIS — F209 Schizophrenia, unspecified: Secondary | ICD-10-CM | POA: Diagnosis not present

## 2017-11-02 DIAGNOSIS — F29 Unspecified psychosis not due to a substance or known physiological condition: Secondary | ICD-10-CM | POA: Diagnosis not present

## 2017-11-02 DIAGNOSIS — F321 Major depressive disorder, single episode, moderate: Secondary | ICD-10-CM | POA: Diagnosis not present

## 2017-11-02 DIAGNOSIS — E119 Type 2 diabetes mellitus without complications: Secondary | ICD-10-CM | POA: Diagnosis not present

## 2017-11-02 DIAGNOSIS — F419 Anxiety disorder, unspecified: Secondary | ICD-10-CM | POA: Diagnosis not present

## 2017-11-02 DIAGNOSIS — Z9183 Wandering in diseases classified elsewhere: Secondary | ICD-10-CM | POA: Diagnosis not present

## 2017-11-03 DIAGNOSIS — B351 Tinea unguium: Secondary | ICD-10-CM | POA: Diagnosis not present

## 2017-11-03 DIAGNOSIS — M79675 Pain in left toe(s): Secondary | ICD-10-CM | POA: Diagnosis not present

## 2017-11-10 DIAGNOSIS — R569 Unspecified convulsions: Secondary | ICD-10-CM | POA: Diagnosis not present

## 2017-11-10 DIAGNOSIS — Z5181 Encounter for therapeutic drug level monitoring: Secondary | ICD-10-CM | POA: Diagnosis not present

## 2017-11-15 DIAGNOSIS — F0281 Dementia in other diseases classified elsewhere with behavioral disturbance: Secondary | ICD-10-CM | POA: Diagnosis not present

## 2017-11-15 DIAGNOSIS — E119 Type 2 diabetes mellitus without complications: Secondary | ICD-10-CM | POA: Diagnosis not present

## 2017-11-15 DIAGNOSIS — I10 Essential (primary) hypertension: Secondary | ICD-10-CM | POA: Diagnosis not present

## 2017-11-15 DIAGNOSIS — F209 Schizophrenia, unspecified: Secondary | ICD-10-CM | POA: Diagnosis not present

## 2017-11-30 DIAGNOSIS — F419 Anxiety disorder, unspecified: Secondary | ICD-10-CM | POA: Diagnosis not present

## 2017-11-30 DIAGNOSIS — F329 Major depressive disorder, single episode, unspecified: Secondary | ICD-10-CM | POA: Diagnosis not present

## 2017-11-30 DIAGNOSIS — F209 Schizophrenia, unspecified: Secondary | ICD-10-CM | POA: Diagnosis not present

## 2017-11-30 DIAGNOSIS — E119 Type 2 diabetes mellitus without complications: Secondary | ICD-10-CM | POA: Diagnosis not present

## 2017-11-30 DIAGNOSIS — Z9183 Wandering in diseases classified elsewhere: Secondary | ICD-10-CM | POA: Diagnosis not present

## 2017-11-30 DIAGNOSIS — F0391 Unspecified dementia with behavioral disturbance: Secondary | ICD-10-CM | POA: Diagnosis not present

## 2017-12-08 DIAGNOSIS — Z9183 Wandering in diseases classified elsewhere: Secondary | ICD-10-CM | POA: Diagnosis not present

## 2017-12-08 DIAGNOSIS — F0391 Unspecified dementia with behavioral disturbance: Secondary | ICD-10-CM | POA: Diagnosis not present

## 2017-12-08 DIAGNOSIS — F329 Major depressive disorder, single episode, unspecified: Secondary | ICD-10-CM | POA: Diagnosis not present

## 2017-12-08 DIAGNOSIS — E119 Type 2 diabetes mellitus without complications: Secondary | ICD-10-CM | POA: Diagnosis not present

## 2017-12-08 DIAGNOSIS — F419 Anxiety disorder, unspecified: Secondary | ICD-10-CM | POA: Diagnosis not present

## 2017-12-08 DIAGNOSIS — F209 Schizophrenia, unspecified: Secondary | ICD-10-CM | POA: Diagnosis not present

## 2017-12-11 DIAGNOSIS — F209 Schizophrenia, unspecified: Secondary | ICD-10-CM | POA: Diagnosis not present

## 2017-12-11 DIAGNOSIS — F0391 Unspecified dementia with behavioral disturbance: Secondary | ICD-10-CM | POA: Diagnosis not present

## 2017-12-11 DIAGNOSIS — E119 Type 2 diabetes mellitus without complications: Secondary | ICD-10-CM | POA: Diagnosis not present

## 2017-12-11 DIAGNOSIS — F329 Major depressive disorder, single episode, unspecified: Secondary | ICD-10-CM | POA: Diagnosis not present

## 2017-12-11 DIAGNOSIS — Z9183 Wandering in diseases classified elsewhere: Secondary | ICD-10-CM | POA: Diagnosis not present

## 2017-12-11 DIAGNOSIS — F419 Anxiety disorder, unspecified: Secondary | ICD-10-CM | POA: Diagnosis not present

## 2017-12-28 DIAGNOSIS — F419 Anxiety disorder, unspecified: Secondary | ICD-10-CM | POA: Diagnosis not present

## 2017-12-28 DIAGNOSIS — F0391 Unspecified dementia with behavioral disturbance: Secondary | ICD-10-CM | POA: Diagnosis not present

## 2017-12-28 DIAGNOSIS — F329 Major depressive disorder, single episode, unspecified: Secondary | ICD-10-CM | POA: Diagnosis not present

## 2017-12-28 DIAGNOSIS — F209 Schizophrenia, unspecified: Secondary | ICD-10-CM | POA: Diagnosis not present

## 2017-12-28 DIAGNOSIS — Z9183 Wandering in diseases classified elsewhere: Secondary | ICD-10-CM | POA: Diagnosis not present

## 2017-12-28 DIAGNOSIS — E119 Type 2 diabetes mellitus without complications: Secondary | ICD-10-CM | POA: Diagnosis not present

## 2018-01-05 DIAGNOSIS — B351 Tinea unguium: Secondary | ICD-10-CM | POA: Diagnosis not present

## 2018-01-05 DIAGNOSIS — M79675 Pain in left toe(s): Secondary | ICD-10-CM | POA: Diagnosis not present

## 2018-01-05 DIAGNOSIS — M79674 Pain in right toe(s): Secondary | ICD-10-CM | POA: Diagnosis not present

## 2018-01-11 DIAGNOSIS — F0391 Unspecified dementia with behavioral disturbance: Secondary | ICD-10-CM | POA: Diagnosis not present

## 2018-01-11 DIAGNOSIS — F418 Other specified anxiety disorders: Secondary | ICD-10-CM | POA: Diagnosis not present

## 2018-01-11 DIAGNOSIS — F321 Major depressive disorder, single episode, moderate: Secondary | ICD-10-CM | POA: Diagnosis not present

## 2018-01-11 DIAGNOSIS — F29 Unspecified psychosis not due to a substance or known physiological condition: Secondary | ICD-10-CM | POA: Diagnosis not present

## 2018-01-20 DIAGNOSIS — H2513 Age-related nuclear cataract, bilateral: Secondary | ICD-10-CM | POA: Diagnosis not present

## 2018-01-20 DIAGNOSIS — H25013 Cortical age-related cataract, bilateral: Secondary | ICD-10-CM | POA: Diagnosis not present

## 2018-01-20 DIAGNOSIS — H2589 Other age-related cataract: Secondary | ICD-10-CM | POA: Diagnosis not present

## 2018-01-25 DIAGNOSIS — F209 Schizophrenia, unspecified: Secondary | ICD-10-CM | POA: Diagnosis not present

## 2018-01-25 DIAGNOSIS — Z9183 Wandering in diseases classified elsewhere: Secondary | ICD-10-CM | POA: Diagnosis not present

## 2018-01-25 DIAGNOSIS — F419 Anxiety disorder, unspecified: Secondary | ICD-10-CM | POA: Diagnosis not present

## 2018-01-25 DIAGNOSIS — F0391 Unspecified dementia with behavioral disturbance: Secondary | ICD-10-CM | POA: Diagnosis not present

## 2018-01-25 DIAGNOSIS — F329 Major depressive disorder, single episode, unspecified: Secondary | ICD-10-CM | POA: Diagnosis not present

## 2018-01-25 DIAGNOSIS — E119 Type 2 diabetes mellitus without complications: Secondary | ICD-10-CM | POA: Diagnosis not present

## 2018-02-03 DIAGNOSIS — F419 Anxiety disorder, unspecified: Secondary | ICD-10-CM | POA: Diagnosis not present

## 2018-02-03 DIAGNOSIS — Z9183 Wandering in diseases classified elsewhere: Secondary | ICD-10-CM | POA: Diagnosis not present

## 2018-02-03 DIAGNOSIS — F0391 Unspecified dementia with behavioral disturbance: Secondary | ICD-10-CM | POA: Diagnosis not present

## 2018-02-03 DIAGNOSIS — F209 Schizophrenia, unspecified: Secondary | ICD-10-CM | POA: Diagnosis not present

## 2018-02-03 DIAGNOSIS — F329 Major depressive disorder, single episode, unspecified: Secondary | ICD-10-CM | POA: Diagnosis not present

## 2018-02-03 DIAGNOSIS — E119 Type 2 diabetes mellitus without complications: Secondary | ICD-10-CM | POA: Diagnosis not present

## 2018-02-09 DIAGNOSIS — Z9183 Wandering in diseases classified elsewhere: Secondary | ICD-10-CM | POA: Diagnosis not present

## 2018-02-09 DIAGNOSIS — F0391 Unspecified dementia with behavioral disturbance: Secondary | ICD-10-CM | POA: Diagnosis not present

## 2018-02-09 DIAGNOSIS — F209 Schizophrenia, unspecified: Secondary | ICD-10-CM | POA: Diagnosis not present

## 2018-02-09 DIAGNOSIS — F329 Major depressive disorder, single episode, unspecified: Secondary | ICD-10-CM | POA: Diagnosis not present

## 2018-02-09 DIAGNOSIS — F419 Anxiety disorder, unspecified: Secondary | ICD-10-CM | POA: Diagnosis not present

## 2018-02-09 DIAGNOSIS — E119 Type 2 diabetes mellitus without complications: Secondary | ICD-10-CM | POA: Diagnosis not present

## 2018-02-23 DIAGNOSIS — Z9183 Wandering in diseases classified elsewhere: Secondary | ICD-10-CM | POA: Diagnosis not present

## 2018-02-23 DIAGNOSIS — F0391 Unspecified dementia with behavioral disturbance: Secondary | ICD-10-CM | POA: Diagnosis not present

## 2018-02-23 DIAGNOSIS — F329 Major depressive disorder, single episode, unspecified: Secondary | ICD-10-CM | POA: Diagnosis not present

## 2018-02-23 DIAGNOSIS — E119 Type 2 diabetes mellitus without complications: Secondary | ICD-10-CM | POA: Diagnosis not present

## 2018-02-23 DIAGNOSIS — F419 Anxiety disorder, unspecified: Secondary | ICD-10-CM | POA: Diagnosis not present

## 2018-02-23 DIAGNOSIS — F209 Schizophrenia, unspecified: Secondary | ICD-10-CM | POA: Diagnosis not present

## 2018-03-09 DIAGNOSIS — M79674 Pain in right toe(s): Secondary | ICD-10-CM | POA: Diagnosis not present

## 2018-03-09 DIAGNOSIS — M79675 Pain in left toe(s): Secondary | ICD-10-CM | POA: Diagnosis not present

## 2018-03-09 DIAGNOSIS — B351 Tinea unguium: Secondary | ICD-10-CM | POA: Diagnosis not present

## 2018-03-15 DIAGNOSIS — F29 Unspecified psychosis not due to a substance or known physiological condition: Secondary | ICD-10-CM | POA: Diagnosis not present

## 2018-03-15 DIAGNOSIS — F321 Major depressive disorder, single episode, moderate: Secondary | ICD-10-CM | POA: Diagnosis not present

## 2018-03-15 DIAGNOSIS — F0391 Unspecified dementia with behavioral disturbance: Secondary | ICD-10-CM | POA: Diagnosis not present

## 2018-03-15 DIAGNOSIS — F418 Other specified anxiety disorders: Secondary | ICD-10-CM | POA: Diagnosis not present

## 2018-03-21 DIAGNOSIS — F0281 Dementia in other diseases classified elsewhere with behavioral disturbance: Secondary | ICD-10-CM | POA: Diagnosis not present

## 2018-03-21 DIAGNOSIS — I10 Essential (primary) hypertension: Secondary | ICD-10-CM | POA: Diagnosis not present

## 2018-03-21 DIAGNOSIS — E119 Type 2 diabetes mellitus without complications: Secondary | ICD-10-CM | POA: Diagnosis not present

## 2018-03-21 DIAGNOSIS — F209 Schizophrenia, unspecified: Secondary | ICD-10-CM | POA: Diagnosis not present

## 2018-03-23 DIAGNOSIS — F0391 Unspecified dementia with behavioral disturbance: Secondary | ICD-10-CM | POA: Diagnosis not present

## 2018-03-23 DIAGNOSIS — F329 Major depressive disorder, single episode, unspecified: Secondary | ICD-10-CM | POA: Diagnosis not present

## 2018-03-23 DIAGNOSIS — D649 Anemia, unspecified: Secondary | ICD-10-CM | POA: Diagnosis not present

## 2018-03-23 DIAGNOSIS — E119 Type 2 diabetes mellitus without complications: Secondary | ICD-10-CM | POA: Diagnosis not present

## 2018-03-23 DIAGNOSIS — F209 Schizophrenia, unspecified: Secondary | ICD-10-CM | POA: Diagnosis not present

## 2018-03-23 DIAGNOSIS — F419 Anxiety disorder, unspecified: Secondary | ICD-10-CM | POA: Diagnosis not present

## 2018-03-23 DIAGNOSIS — Z79899 Other long term (current) drug therapy: Secondary | ICD-10-CM | POA: Diagnosis not present

## 2018-03-23 DIAGNOSIS — Z9183 Wandering in diseases classified elsewhere: Secondary | ICD-10-CM | POA: Diagnosis not present

## 2018-04-07 DIAGNOSIS — F419 Anxiety disorder, unspecified: Secondary | ICD-10-CM | POA: Diagnosis not present

## 2018-04-07 DIAGNOSIS — F209 Schizophrenia, unspecified: Secondary | ICD-10-CM | POA: Diagnosis not present

## 2018-04-07 DIAGNOSIS — Z9183 Wandering in diseases classified elsewhere: Secondary | ICD-10-CM | POA: Diagnosis not present

## 2018-04-07 DIAGNOSIS — E119 Type 2 diabetes mellitus without complications: Secondary | ICD-10-CM | POA: Diagnosis not present

## 2018-04-07 DIAGNOSIS — F329 Major depressive disorder, single episode, unspecified: Secondary | ICD-10-CM | POA: Diagnosis not present

## 2018-04-07 DIAGNOSIS — F0391 Unspecified dementia with behavioral disturbance: Secondary | ICD-10-CM | POA: Diagnosis not present

## 2018-04-10 DIAGNOSIS — F0391 Unspecified dementia with behavioral disturbance: Secondary | ICD-10-CM | POA: Diagnosis not present

## 2018-04-10 DIAGNOSIS — F209 Schizophrenia, unspecified: Secondary | ICD-10-CM | POA: Diagnosis not present

## 2018-04-10 DIAGNOSIS — Z9183 Wandering in diseases classified elsewhere: Secondary | ICD-10-CM | POA: Diagnosis not present

## 2018-04-10 DIAGNOSIS — E119 Type 2 diabetes mellitus without complications: Secondary | ICD-10-CM | POA: Diagnosis not present

## 2018-04-10 DIAGNOSIS — F419 Anxiety disorder, unspecified: Secondary | ICD-10-CM | POA: Diagnosis not present

## 2018-04-10 DIAGNOSIS — F329 Major depressive disorder, single episode, unspecified: Secondary | ICD-10-CM | POA: Diagnosis not present

## 2018-04-11 DIAGNOSIS — F419 Anxiety disorder, unspecified: Secondary | ICD-10-CM | POA: Diagnosis not present

## 2018-04-11 DIAGNOSIS — F209 Schizophrenia, unspecified: Secondary | ICD-10-CM | POA: Diagnosis not present

## 2018-04-11 DIAGNOSIS — F0391 Unspecified dementia with behavioral disturbance: Secondary | ICD-10-CM | POA: Diagnosis not present

## 2018-04-11 DIAGNOSIS — F329 Major depressive disorder, single episode, unspecified: Secondary | ICD-10-CM | POA: Diagnosis not present

## 2018-04-11 DIAGNOSIS — Z9183 Wandering in diseases classified elsewhere: Secondary | ICD-10-CM | POA: Diagnosis not present

## 2018-04-11 DIAGNOSIS — F0281 Dementia in other diseases classified elsewhere with behavioral disturbance: Secondary | ICD-10-CM | POA: Diagnosis not present

## 2018-04-11 DIAGNOSIS — E119 Type 2 diabetes mellitus without complications: Secondary | ICD-10-CM | POA: Diagnosis not present

## 2018-04-11 DIAGNOSIS — W19XXXA Unspecified fall, initial encounter: Secondary | ICD-10-CM | POA: Diagnosis not present

## 2018-04-18 DIAGNOSIS — E119 Type 2 diabetes mellitus without complications: Secondary | ICD-10-CM | POA: Diagnosis not present

## 2018-04-18 DIAGNOSIS — F0391 Unspecified dementia with behavioral disturbance: Secondary | ICD-10-CM | POA: Diagnosis not present

## 2018-04-18 DIAGNOSIS — F209 Schizophrenia, unspecified: Secondary | ICD-10-CM | POA: Diagnosis not present

## 2018-04-18 DIAGNOSIS — F419 Anxiety disorder, unspecified: Secondary | ICD-10-CM | POA: Diagnosis not present

## 2018-04-18 DIAGNOSIS — F329 Major depressive disorder, single episode, unspecified: Secondary | ICD-10-CM | POA: Diagnosis not present

## 2018-04-18 DIAGNOSIS — Z9183 Wandering in diseases classified elsewhere: Secondary | ICD-10-CM | POA: Diagnosis not present

## 2018-04-19 DIAGNOSIS — F329 Major depressive disorder, single episode, unspecified: Secondary | ICD-10-CM | POA: Diagnosis not present

## 2018-04-19 DIAGNOSIS — F419 Anxiety disorder, unspecified: Secondary | ICD-10-CM | POA: Diagnosis not present

## 2018-04-19 DIAGNOSIS — E119 Type 2 diabetes mellitus without complications: Secondary | ICD-10-CM | POA: Diagnosis not present

## 2018-04-19 DIAGNOSIS — F209 Schizophrenia, unspecified: Secondary | ICD-10-CM | POA: Diagnosis not present

## 2018-04-19 DIAGNOSIS — F0391 Unspecified dementia with behavioral disturbance: Secondary | ICD-10-CM | POA: Diagnosis not present

## 2018-04-19 DIAGNOSIS — Z9183 Wandering in diseases classified elsewhere: Secondary | ICD-10-CM | POA: Diagnosis not present

## 2018-04-21 DIAGNOSIS — F329 Major depressive disorder, single episode, unspecified: Secondary | ICD-10-CM | POA: Diagnosis not present

## 2018-04-21 DIAGNOSIS — F0391 Unspecified dementia with behavioral disturbance: Secondary | ICD-10-CM | POA: Diagnosis not present

## 2018-04-21 DIAGNOSIS — F209 Schizophrenia, unspecified: Secondary | ICD-10-CM | POA: Diagnosis not present

## 2018-04-21 DIAGNOSIS — E119 Type 2 diabetes mellitus without complications: Secondary | ICD-10-CM | POA: Diagnosis not present

## 2018-04-21 DIAGNOSIS — F419 Anxiety disorder, unspecified: Secondary | ICD-10-CM | POA: Diagnosis not present

## 2018-04-21 DIAGNOSIS — Z9183 Wandering in diseases classified elsewhere: Secondary | ICD-10-CM | POA: Diagnosis not present

## 2018-04-26 DIAGNOSIS — Z9183 Wandering in diseases classified elsewhere: Secondary | ICD-10-CM | POA: Diagnosis not present

## 2018-04-26 DIAGNOSIS — F209 Schizophrenia, unspecified: Secondary | ICD-10-CM | POA: Diagnosis not present

## 2018-04-26 DIAGNOSIS — F0391 Unspecified dementia with behavioral disturbance: Secondary | ICD-10-CM | POA: Diagnosis not present

## 2018-04-26 DIAGNOSIS — F419 Anxiety disorder, unspecified: Secondary | ICD-10-CM | POA: Diagnosis not present

## 2018-04-26 DIAGNOSIS — E119 Type 2 diabetes mellitus without complications: Secondary | ICD-10-CM | POA: Diagnosis not present

## 2018-04-26 DIAGNOSIS — F329 Major depressive disorder, single episode, unspecified: Secondary | ICD-10-CM | POA: Diagnosis not present

## 2018-04-28 DIAGNOSIS — Z9183 Wandering in diseases classified elsewhere: Secondary | ICD-10-CM | POA: Diagnosis not present

## 2018-04-28 DIAGNOSIS — F329 Major depressive disorder, single episode, unspecified: Secondary | ICD-10-CM | POA: Diagnosis not present

## 2018-04-28 DIAGNOSIS — F419 Anxiety disorder, unspecified: Secondary | ICD-10-CM | POA: Diagnosis not present

## 2018-04-28 DIAGNOSIS — F209 Schizophrenia, unspecified: Secondary | ICD-10-CM | POA: Diagnosis not present

## 2018-04-28 DIAGNOSIS — F0391 Unspecified dementia with behavioral disturbance: Secondary | ICD-10-CM | POA: Diagnosis not present

## 2018-04-28 DIAGNOSIS — E119 Type 2 diabetes mellitus without complications: Secondary | ICD-10-CM | POA: Diagnosis not present

## 2018-05-03 DIAGNOSIS — F419 Anxiety disorder, unspecified: Secondary | ICD-10-CM | POA: Diagnosis not present

## 2018-05-03 DIAGNOSIS — F0391 Unspecified dementia with behavioral disturbance: Secondary | ICD-10-CM | POA: Diagnosis not present

## 2018-05-03 DIAGNOSIS — F321 Major depressive disorder, single episode, moderate: Secondary | ICD-10-CM | POA: Diagnosis not present

## 2018-05-03 DIAGNOSIS — F29 Unspecified psychosis not due to a substance or known physiological condition: Secondary | ICD-10-CM | POA: Diagnosis not present

## 2018-05-03 DIAGNOSIS — F209 Schizophrenia, unspecified: Secondary | ICD-10-CM | POA: Diagnosis not present

## 2018-05-03 DIAGNOSIS — Z9183 Wandering in diseases classified elsewhere: Secondary | ICD-10-CM | POA: Diagnosis not present

## 2018-05-03 DIAGNOSIS — F329 Major depressive disorder, single episode, unspecified: Secondary | ICD-10-CM | POA: Diagnosis not present

## 2018-05-03 DIAGNOSIS — F418 Other specified anxiety disorders: Secondary | ICD-10-CM | POA: Diagnosis not present

## 2018-05-03 DIAGNOSIS — E119 Type 2 diabetes mellitus without complications: Secondary | ICD-10-CM | POA: Diagnosis not present

## 2018-05-05 DIAGNOSIS — F0391 Unspecified dementia with behavioral disturbance: Secondary | ICD-10-CM | POA: Diagnosis not present

## 2018-05-05 DIAGNOSIS — F209 Schizophrenia, unspecified: Secondary | ICD-10-CM | POA: Diagnosis not present

## 2018-05-05 DIAGNOSIS — Z9183 Wandering in diseases classified elsewhere: Secondary | ICD-10-CM | POA: Diagnosis not present

## 2018-05-05 DIAGNOSIS — F329 Major depressive disorder, single episode, unspecified: Secondary | ICD-10-CM | POA: Diagnosis not present

## 2018-05-05 DIAGNOSIS — E119 Type 2 diabetes mellitus without complications: Secondary | ICD-10-CM | POA: Diagnosis not present

## 2018-05-05 DIAGNOSIS — F419 Anxiety disorder, unspecified: Secondary | ICD-10-CM | POA: Diagnosis not present

## 2018-05-10 DIAGNOSIS — F209 Schizophrenia, unspecified: Secondary | ICD-10-CM | POA: Diagnosis not present

## 2018-05-10 DIAGNOSIS — F329 Major depressive disorder, single episode, unspecified: Secondary | ICD-10-CM | POA: Diagnosis not present

## 2018-05-10 DIAGNOSIS — E119 Type 2 diabetes mellitus without complications: Secondary | ICD-10-CM | POA: Diagnosis not present

## 2018-05-10 DIAGNOSIS — Z9183 Wandering in diseases classified elsewhere: Secondary | ICD-10-CM | POA: Diagnosis not present

## 2018-05-10 DIAGNOSIS — F0391 Unspecified dementia with behavioral disturbance: Secondary | ICD-10-CM | POA: Diagnosis not present

## 2018-05-10 DIAGNOSIS — F419 Anxiety disorder, unspecified: Secondary | ICD-10-CM | POA: Diagnosis not present

## 2018-05-11 DIAGNOSIS — M79675 Pain in left toe(s): Secondary | ICD-10-CM | POA: Diagnosis not present

## 2018-05-11 DIAGNOSIS — M79674 Pain in right toe(s): Secondary | ICD-10-CM | POA: Diagnosis not present

## 2018-05-11 DIAGNOSIS — B351 Tinea unguium: Secondary | ICD-10-CM | POA: Diagnosis not present

## 2018-05-12 DIAGNOSIS — F209 Schizophrenia, unspecified: Secondary | ICD-10-CM | POA: Diagnosis not present

## 2018-05-12 DIAGNOSIS — F0391 Unspecified dementia with behavioral disturbance: Secondary | ICD-10-CM | POA: Diagnosis not present

## 2018-05-12 DIAGNOSIS — F419 Anxiety disorder, unspecified: Secondary | ICD-10-CM | POA: Diagnosis not present

## 2018-05-12 DIAGNOSIS — Z9183 Wandering in diseases classified elsewhere: Secondary | ICD-10-CM | POA: Diagnosis not present

## 2018-05-12 DIAGNOSIS — F329 Major depressive disorder, single episode, unspecified: Secondary | ICD-10-CM | POA: Diagnosis not present

## 2018-05-12 DIAGNOSIS — E119 Type 2 diabetes mellitus without complications: Secondary | ICD-10-CM | POA: Diagnosis not present

## 2018-05-17 DIAGNOSIS — F321 Major depressive disorder, single episode, moderate: Secondary | ICD-10-CM | POA: Diagnosis not present

## 2018-05-17 DIAGNOSIS — Z9183 Wandering in diseases classified elsewhere: Secondary | ICD-10-CM | POA: Diagnosis not present

## 2018-05-17 DIAGNOSIS — F209 Schizophrenia, unspecified: Secondary | ICD-10-CM | POA: Diagnosis not present

## 2018-05-17 DIAGNOSIS — E119 Type 2 diabetes mellitus without complications: Secondary | ICD-10-CM | POA: Diagnosis not present

## 2018-05-17 DIAGNOSIS — F29 Unspecified psychosis not due to a substance or known physiological condition: Secondary | ICD-10-CM | POA: Diagnosis not present

## 2018-05-17 DIAGNOSIS — F418 Other specified anxiety disorders: Secondary | ICD-10-CM | POA: Diagnosis not present

## 2018-05-17 DIAGNOSIS — F419 Anxiety disorder, unspecified: Secondary | ICD-10-CM | POA: Diagnosis not present

## 2018-05-17 DIAGNOSIS — F329 Major depressive disorder, single episode, unspecified: Secondary | ICD-10-CM | POA: Diagnosis not present

## 2018-05-17 DIAGNOSIS — F0391 Unspecified dementia with behavioral disturbance: Secondary | ICD-10-CM | POA: Diagnosis not present

## 2018-05-19 DIAGNOSIS — E119 Type 2 diabetes mellitus without complications: Secondary | ICD-10-CM | POA: Diagnosis not present

## 2018-05-19 DIAGNOSIS — F329 Major depressive disorder, single episode, unspecified: Secondary | ICD-10-CM | POA: Diagnosis not present

## 2018-05-19 DIAGNOSIS — F419 Anxiety disorder, unspecified: Secondary | ICD-10-CM | POA: Diagnosis not present

## 2018-05-19 DIAGNOSIS — F0391 Unspecified dementia with behavioral disturbance: Secondary | ICD-10-CM | POA: Diagnosis not present

## 2018-05-19 DIAGNOSIS — F209 Schizophrenia, unspecified: Secondary | ICD-10-CM | POA: Diagnosis not present

## 2018-05-19 DIAGNOSIS — Z9183 Wandering in diseases classified elsewhere: Secondary | ICD-10-CM | POA: Diagnosis not present

## 2018-05-23 DIAGNOSIS — F0281 Dementia in other diseases classified elsewhere with behavioral disturbance: Secondary | ICD-10-CM | POA: Diagnosis not present

## 2018-05-23 DIAGNOSIS — R4181 Age-related cognitive decline: Secondary | ICD-10-CM | POA: Diagnosis not present

## 2018-05-23 DIAGNOSIS — F209 Schizophrenia, unspecified: Secondary | ICD-10-CM | POA: Diagnosis not present

## 2018-05-25 DIAGNOSIS — E119 Type 2 diabetes mellitus without complications: Secondary | ICD-10-CM | POA: Diagnosis not present

## 2018-05-25 DIAGNOSIS — R319 Hematuria, unspecified: Secondary | ICD-10-CM | POA: Diagnosis not present

## 2018-05-25 DIAGNOSIS — Z5181 Encounter for therapeutic drug level monitoring: Secondary | ICD-10-CM | POA: Diagnosis not present

## 2018-05-25 DIAGNOSIS — N390 Urinary tract infection, site not specified: Secondary | ICD-10-CM | POA: Diagnosis not present

## 2018-05-25 DIAGNOSIS — Z79899 Other long term (current) drug therapy: Secondary | ICD-10-CM | POA: Diagnosis not present

## 2018-05-26 DIAGNOSIS — E119 Type 2 diabetes mellitus without complications: Secondary | ICD-10-CM | POA: Diagnosis not present

## 2018-05-26 DIAGNOSIS — F419 Anxiety disorder, unspecified: Secondary | ICD-10-CM | POA: Diagnosis not present

## 2018-05-26 DIAGNOSIS — F0391 Unspecified dementia with behavioral disturbance: Secondary | ICD-10-CM | POA: Diagnosis not present

## 2018-05-26 DIAGNOSIS — F209 Schizophrenia, unspecified: Secondary | ICD-10-CM | POA: Diagnosis not present

## 2018-05-26 DIAGNOSIS — F329 Major depressive disorder, single episode, unspecified: Secondary | ICD-10-CM | POA: Diagnosis not present

## 2018-05-26 DIAGNOSIS — Z9183 Wandering in diseases classified elsewhere: Secondary | ICD-10-CM | POA: Diagnosis not present

## 2018-05-27 DIAGNOSIS — Z9183 Wandering in diseases classified elsewhere: Secondary | ICD-10-CM | POA: Diagnosis not present

## 2018-05-27 DIAGNOSIS — F329 Major depressive disorder, single episode, unspecified: Secondary | ICD-10-CM | POA: Diagnosis not present

## 2018-05-27 DIAGNOSIS — E119 Type 2 diabetes mellitus without complications: Secondary | ICD-10-CM | POA: Diagnosis not present

## 2018-05-27 DIAGNOSIS — F0391 Unspecified dementia with behavioral disturbance: Secondary | ICD-10-CM | POA: Diagnosis not present

## 2018-05-27 DIAGNOSIS — F209 Schizophrenia, unspecified: Secondary | ICD-10-CM | POA: Diagnosis not present

## 2018-05-27 DIAGNOSIS — F419 Anxiety disorder, unspecified: Secondary | ICD-10-CM | POA: Diagnosis not present

## 2018-05-31 DIAGNOSIS — F329 Major depressive disorder, single episode, unspecified: Secondary | ICD-10-CM | POA: Diagnosis not present

## 2018-05-31 DIAGNOSIS — Z9183 Wandering in diseases classified elsewhere: Secondary | ICD-10-CM | POA: Diagnosis not present

## 2018-05-31 DIAGNOSIS — F419 Anxiety disorder, unspecified: Secondary | ICD-10-CM | POA: Diagnosis not present

## 2018-05-31 DIAGNOSIS — F0391 Unspecified dementia with behavioral disturbance: Secondary | ICD-10-CM | POA: Diagnosis not present

## 2018-05-31 DIAGNOSIS — F209 Schizophrenia, unspecified: Secondary | ICD-10-CM | POA: Diagnosis not present

## 2018-05-31 DIAGNOSIS — E119 Type 2 diabetes mellitus without complications: Secondary | ICD-10-CM | POA: Diagnosis not present

## 2018-06-02 DIAGNOSIS — E119 Type 2 diabetes mellitus without complications: Secondary | ICD-10-CM | POA: Diagnosis not present

## 2018-06-02 DIAGNOSIS — F329 Major depressive disorder, single episode, unspecified: Secondary | ICD-10-CM | POA: Diagnosis not present

## 2018-06-02 DIAGNOSIS — Z9183 Wandering in diseases classified elsewhere: Secondary | ICD-10-CM | POA: Diagnosis not present

## 2018-06-02 DIAGNOSIS — F209 Schizophrenia, unspecified: Secondary | ICD-10-CM | POA: Diagnosis not present

## 2018-06-02 DIAGNOSIS — F419 Anxiety disorder, unspecified: Secondary | ICD-10-CM | POA: Diagnosis not present

## 2018-06-02 DIAGNOSIS — F0391 Unspecified dementia with behavioral disturbance: Secondary | ICD-10-CM | POA: Diagnosis not present

## 2018-06-06 DIAGNOSIS — F419 Anxiety disorder, unspecified: Secondary | ICD-10-CM | POA: Diagnosis not present

## 2018-06-06 DIAGNOSIS — E119 Type 2 diabetes mellitus without complications: Secondary | ICD-10-CM | POA: Diagnosis not present

## 2018-06-06 DIAGNOSIS — Z9183 Wandering in diseases classified elsewhere: Secondary | ICD-10-CM | POA: Diagnosis not present

## 2018-06-06 DIAGNOSIS — F329 Major depressive disorder, single episode, unspecified: Secondary | ICD-10-CM | POA: Diagnosis not present

## 2018-06-06 DIAGNOSIS — F209 Schizophrenia, unspecified: Secondary | ICD-10-CM | POA: Diagnosis not present

## 2018-06-06 DIAGNOSIS — F0391 Unspecified dementia with behavioral disturbance: Secondary | ICD-10-CM | POA: Diagnosis not present

## 2018-06-07 DIAGNOSIS — F329 Major depressive disorder, single episode, unspecified: Secondary | ICD-10-CM | POA: Diagnosis not present

## 2018-06-07 DIAGNOSIS — E119 Type 2 diabetes mellitus without complications: Secondary | ICD-10-CM | POA: Diagnosis not present

## 2018-06-07 DIAGNOSIS — Z9183 Wandering in diseases classified elsewhere: Secondary | ICD-10-CM | POA: Diagnosis not present

## 2018-06-07 DIAGNOSIS — F0391 Unspecified dementia with behavioral disturbance: Secondary | ICD-10-CM | POA: Diagnosis not present

## 2018-06-07 DIAGNOSIS — F419 Anxiety disorder, unspecified: Secondary | ICD-10-CM | POA: Diagnosis not present

## 2018-06-07 DIAGNOSIS — F209 Schizophrenia, unspecified: Secondary | ICD-10-CM | POA: Diagnosis not present

## 2018-06-08 DIAGNOSIS — F0391 Unspecified dementia with behavioral disturbance: Secondary | ICD-10-CM | POA: Diagnosis not present

## 2018-06-08 DIAGNOSIS — Z23 Encounter for immunization: Secondary | ICD-10-CM | POA: Diagnosis not present

## 2018-06-08 DIAGNOSIS — E119 Type 2 diabetes mellitus without complications: Secondary | ICD-10-CM | POA: Diagnosis not present

## 2018-06-08 DIAGNOSIS — Z9183 Wandering in diseases classified elsewhere: Secondary | ICD-10-CM | POA: Diagnosis not present

## 2018-06-08 DIAGNOSIS — F209 Schizophrenia, unspecified: Secondary | ICD-10-CM | POA: Diagnosis not present

## 2018-06-08 DIAGNOSIS — F419 Anxiety disorder, unspecified: Secondary | ICD-10-CM | POA: Diagnosis not present

## 2018-06-08 DIAGNOSIS — F329 Major depressive disorder, single episode, unspecified: Secondary | ICD-10-CM | POA: Diagnosis not present

## 2018-06-09 DIAGNOSIS — F329 Major depressive disorder, single episode, unspecified: Secondary | ICD-10-CM | POA: Diagnosis not present

## 2018-06-09 DIAGNOSIS — Z87440 Personal history of urinary (tract) infections: Secondary | ICD-10-CM | POA: Diagnosis not present

## 2018-06-09 DIAGNOSIS — E119 Type 2 diabetes mellitus without complications: Secondary | ICD-10-CM | POA: Diagnosis not present

## 2018-06-09 DIAGNOSIS — F419 Anxiety disorder, unspecified: Secondary | ICD-10-CM | POA: Diagnosis not present

## 2018-06-09 DIAGNOSIS — Z79899 Other long term (current) drug therapy: Secondary | ICD-10-CM | POA: Diagnosis not present

## 2018-06-09 DIAGNOSIS — I10 Essential (primary) hypertension: Secondary | ICD-10-CM | POA: Diagnosis not present

## 2018-06-09 DIAGNOSIS — Z9183 Wandering in diseases classified elsewhere: Secondary | ICD-10-CM | POA: Diagnosis not present

## 2018-06-09 DIAGNOSIS — F209 Schizophrenia, unspecified: Secondary | ICD-10-CM | POA: Diagnosis not present

## 2018-06-09 DIAGNOSIS — F0391 Unspecified dementia with behavioral disturbance: Secondary | ICD-10-CM | POA: Diagnosis not present

## 2018-06-09 DIAGNOSIS — Z9181 History of falling: Secondary | ICD-10-CM | POA: Diagnosis not present

## 2018-06-14 DIAGNOSIS — E119 Type 2 diabetes mellitus without complications: Secondary | ICD-10-CM | POA: Diagnosis not present

## 2018-06-14 DIAGNOSIS — I10 Essential (primary) hypertension: Secondary | ICD-10-CM | POA: Diagnosis not present

## 2018-06-14 DIAGNOSIS — F0391 Unspecified dementia with behavioral disturbance: Secondary | ICD-10-CM | POA: Diagnosis not present

## 2018-06-14 DIAGNOSIS — F209 Schizophrenia, unspecified: Secondary | ICD-10-CM | POA: Diagnosis not present

## 2018-06-14 DIAGNOSIS — Z9183 Wandering in diseases classified elsewhere: Secondary | ICD-10-CM | POA: Diagnosis not present

## 2018-06-14 DIAGNOSIS — F329 Major depressive disorder, single episode, unspecified: Secondary | ICD-10-CM | POA: Diagnosis not present

## 2018-07-05 DIAGNOSIS — F321 Major depressive disorder, single episode, moderate: Secondary | ICD-10-CM | POA: Diagnosis not present

## 2018-07-05 DIAGNOSIS — F29 Unspecified psychosis not due to a substance or known physiological condition: Secondary | ICD-10-CM | POA: Diagnosis not present

## 2018-07-05 DIAGNOSIS — F0391 Unspecified dementia with behavioral disturbance: Secondary | ICD-10-CM | POA: Diagnosis not present

## 2018-07-05 DIAGNOSIS — F418 Other specified anxiety disorders: Secondary | ICD-10-CM | POA: Diagnosis not present

## 2018-07-13 DIAGNOSIS — B351 Tinea unguium: Secondary | ICD-10-CM | POA: Diagnosis not present

## 2018-07-13 DIAGNOSIS — M79675 Pain in left toe(s): Secondary | ICD-10-CM | POA: Diagnosis not present

## 2018-07-13 DIAGNOSIS — M79674 Pain in right toe(s): Secondary | ICD-10-CM | POA: Diagnosis not present

## 2018-07-14 DIAGNOSIS — F329 Major depressive disorder, single episode, unspecified: Secondary | ICD-10-CM | POA: Diagnosis not present

## 2018-07-14 DIAGNOSIS — I10 Essential (primary) hypertension: Secondary | ICD-10-CM | POA: Diagnosis not present

## 2018-07-14 DIAGNOSIS — Z9183 Wandering in diseases classified elsewhere: Secondary | ICD-10-CM | POA: Diagnosis not present

## 2018-07-14 DIAGNOSIS — F209 Schizophrenia, unspecified: Secondary | ICD-10-CM | POA: Diagnosis not present

## 2018-07-14 DIAGNOSIS — F0391 Unspecified dementia with behavioral disturbance: Secondary | ICD-10-CM | POA: Diagnosis not present

## 2018-07-14 DIAGNOSIS — E119 Type 2 diabetes mellitus without complications: Secondary | ICD-10-CM | POA: Diagnosis not present

## 2018-08-03 DIAGNOSIS — H2512 Age-related nuclear cataract, left eye: Secondary | ICD-10-CM | POA: Diagnosis not present

## 2018-08-03 DIAGNOSIS — H25012 Cortical age-related cataract, left eye: Secondary | ICD-10-CM | POA: Diagnosis not present

## 2018-08-03 DIAGNOSIS — H2589 Other age-related cataract: Secondary | ICD-10-CM | POA: Diagnosis not present

## 2018-08-05 DIAGNOSIS — I10 Essential (primary) hypertension: Secondary | ICD-10-CM | POA: Diagnosis not present

## 2018-08-05 DIAGNOSIS — E119 Type 2 diabetes mellitus without complications: Secondary | ICD-10-CM | POA: Diagnosis not present

## 2018-08-05 DIAGNOSIS — Z9183 Wandering in diseases classified elsewhere: Secondary | ICD-10-CM | POA: Diagnosis not present

## 2018-08-05 DIAGNOSIS — F329 Major depressive disorder, single episode, unspecified: Secondary | ICD-10-CM | POA: Diagnosis not present

## 2018-08-05 DIAGNOSIS — F0391 Unspecified dementia with behavioral disturbance: Secondary | ICD-10-CM | POA: Diagnosis not present

## 2018-08-05 DIAGNOSIS — F209 Schizophrenia, unspecified: Secondary | ICD-10-CM | POA: Diagnosis not present

## 2018-08-08 DIAGNOSIS — F0391 Unspecified dementia with behavioral disturbance: Secondary | ICD-10-CM | POA: Diagnosis not present

## 2018-08-08 DIAGNOSIS — F419 Anxiety disorder, unspecified: Secondary | ICD-10-CM | POA: Diagnosis not present

## 2018-08-08 DIAGNOSIS — F329 Major depressive disorder, single episode, unspecified: Secondary | ICD-10-CM | POA: Diagnosis not present

## 2018-08-08 DIAGNOSIS — E119 Type 2 diabetes mellitus without complications: Secondary | ICD-10-CM | POA: Diagnosis not present

## 2018-08-08 DIAGNOSIS — I10 Essential (primary) hypertension: Secondary | ICD-10-CM | POA: Diagnosis not present

## 2018-08-08 DIAGNOSIS — F209 Schizophrenia, unspecified: Secondary | ICD-10-CM | POA: Diagnosis not present

## 2018-08-16 DIAGNOSIS — F0391 Unspecified dementia with behavioral disturbance: Secondary | ICD-10-CM | POA: Diagnosis not present

## 2018-08-16 DIAGNOSIS — F29 Unspecified psychosis not due to a substance or known physiological condition: Secondary | ICD-10-CM | POA: Diagnosis not present

## 2018-08-16 DIAGNOSIS — F321 Major depressive disorder, single episode, moderate: Secondary | ICD-10-CM | POA: Diagnosis not present

## 2018-08-16 DIAGNOSIS — F418 Other specified anxiety disorders: Secondary | ICD-10-CM | POA: Diagnosis not present

## 2018-08-29 DIAGNOSIS — F209 Schizophrenia, unspecified: Secondary | ICD-10-CM | POA: Diagnosis not present

## 2018-08-29 DIAGNOSIS — R296 Repeated falls: Secondary | ICD-10-CM | POA: Diagnosis not present

## 2018-08-29 DIAGNOSIS — F0281 Dementia in other diseases classified elsewhere with behavioral disturbance: Secondary | ICD-10-CM | POA: Diagnosis not present

## 2019-10-11 ENCOUNTER — Encounter: Payer: Self-pay | Admitting: Orthopaedic Surgery

## 2019-10-11 ENCOUNTER — Emergency Department (HOSPITAL_COMMUNITY): Payer: Medicare Other

## 2019-10-11 ENCOUNTER — Encounter (HOSPITAL_COMMUNITY): Payer: Self-pay | Admitting: Emergency Medicine

## 2019-10-11 ENCOUNTER — Other Ambulatory Visit: Payer: Self-pay

## 2019-10-11 ENCOUNTER — Inpatient Hospital Stay (HOSPITAL_COMMUNITY)
Admission: EM | Admit: 2019-10-11 | Discharge: 2019-10-19 | DRG: 521 | Disposition: A | Discharge status: Skilled Nursing Facility | Payer: Medicare Other | Attending: Internal Medicine | Admitting: Internal Medicine

## 2019-10-11 DIAGNOSIS — U071 COVID-19: Secondary | ICD-10-CM | POA: Diagnosis present

## 2019-10-11 DIAGNOSIS — S72002A Fracture of unspecified part of neck of left femur, initial encounter for closed fracture: Secondary | ICD-10-CM | POA: Diagnosis present

## 2019-10-11 DIAGNOSIS — Z880 Allergy status to penicillin: Secondary | ICD-10-CM | POA: Diagnosis not present

## 2019-10-11 DIAGNOSIS — D696 Thrombocytopenia, unspecified: Secondary | ICD-10-CM | POA: Diagnosis present

## 2019-10-11 DIAGNOSIS — S72009A Fracture of unspecified part of neck of unspecified femur, initial encounter for closed fracture: Secondary | ICD-10-CM | POA: Diagnosis not present

## 2019-10-11 DIAGNOSIS — Y92099 Unspecified place in other non-institutional residence as the place of occurrence of the external cause: Secondary | ICD-10-CM | POA: Diagnosis not present

## 2019-10-11 DIAGNOSIS — F329 Major depressive disorder, single episode, unspecified: Secondary | ICD-10-CM | POA: Diagnosis present

## 2019-10-11 DIAGNOSIS — W010XXA Fall on same level from slipping, tripping and stumbling without subsequent striking against object, initial encounter: Secondary | ICD-10-CM | POA: Diagnosis present

## 2019-10-11 DIAGNOSIS — F209 Schizophrenia, unspecified: Secondary | ICD-10-CM | POA: Diagnosis present

## 2019-10-11 DIAGNOSIS — W19XXXA Unspecified fall, initial encounter: Secondary | ICD-10-CM | POA: Insufficient documentation

## 2019-10-11 DIAGNOSIS — L89152 Pressure ulcer of sacral region, stage 2: Secondary | ICD-10-CM | POA: Diagnosis present

## 2019-10-11 DIAGNOSIS — Z419 Encounter for procedure for purposes other than remedying health state, unspecified: Secondary | ICD-10-CM

## 2019-10-11 DIAGNOSIS — Z96649 Presence of unspecified artificial hip joint: Secondary | ICD-10-CM

## 2019-10-11 DIAGNOSIS — E785 Hyperlipidemia, unspecified: Secondary | ICD-10-CM | POA: Diagnosis present

## 2019-10-11 DIAGNOSIS — Z79899 Other long term (current) drug therapy: Secondary | ICD-10-CM

## 2019-10-11 DIAGNOSIS — F0390 Unspecified dementia without behavioral disturbance: Secondary | ICD-10-CM | POA: Diagnosis present

## 2019-10-11 DIAGNOSIS — I10 Essential (primary) hypertension: Secondary | ICD-10-CM | POA: Diagnosis present

## 2019-10-11 DIAGNOSIS — R0600 Dyspnea, unspecified: Secondary | ICD-10-CM

## 2019-10-11 DIAGNOSIS — F0391 Unspecified dementia with behavioral disturbance: Secondary | ICD-10-CM | POA: Diagnosis not present

## 2019-10-11 LAB — C-REACTIVE PROTEIN: CRP: 7.4 mg/dL — ABNORMAL HIGH (ref <1.0)

## 2019-10-11 LAB — CBC WITH DIFFERENTIAL/PLATELET
Abs Immature Granulocytes: 0.01 10*3/uL (ref 0.00–0.07)
Basophils Absolute: 0 10*3/uL (ref 0.0–0.1)
Basophils Relative: 0 %
Eosinophils Absolute: 0 10*3/uL (ref 0.0–0.5)
Eosinophils Relative: 0 %
HCT: 42.5 % (ref 36.0–46.0)
Hemoglobin: 13.5 g/dL (ref 12.0–15.0)
Immature Granulocytes: 0 %
Lymphocytes Relative: 13 %
Lymphs Abs: 0.7 10*3/uL (ref 0.7–4.0)
MCH: 29.7 pg (ref 26.0–34.0)
MCHC: 31.8 g/dL (ref 30.0–36.0)
MCV: 93.6 fL (ref 80.0–100.0)
Monocytes Absolute: 0.8 10*3/uL (ref 0.1–1.0)
Monocytes Relative: 16 %
Neutro Abs: 3.5 10*3/uL (ref 1.7–7.7)
Neutrophils Relative %: 71 %
Platelets: 152 10*3/uL (ref 150–400)
RBC: 4.54 MIL/uL (ref 3.87–5.11)
RDW: 15.3 % (ref 11.5–15.5)
WBC: 5.1 10*3/uL (ref 4.0–10.5)
nRBC: 0 % (ref 0.0–0.2)

## 2019-10-11 LAB — COMPREHENSIVE METABOLIC PANEL
ALT: 27 U/L (ref 0–44)
AST: 38 U/L (ref 15–41)
Albumin: 3.1 g/dL — ABNORMAL LOW (ref 3.5–5.0)
Alkaline Phosphatase: 38 U/L (ref 38–126)
Anion gap: 10 (ref 5–15)
BUN: 20 mg/dL (ref 8–23)
CO2: 27 mmol/L (ref 22–32)
Calcium: 9.7 mg/dL (ref 8.9–10.3)
Chloride: 109 mmol/L (ref 98–111)
Creatinine, Ser: 0.66 mg/dL (ref 0.44–1.00)
GFR calc Af Amer: >60 mL/min (ref >60)
GFR calc non Af Amer: >60 mL/min (ref >60)
Glucose, Bld: 100 mg/dL — ABNORMAL HIGH (ref 70–99)
Potassium: 4 mmol/L (ref 3.5–5.1)
Sodium: 146 mmol/L — ABNORMAL HIGH (ref 135–145)
Total Bilirubin: 0.7 mg/dL (ref 0.3–1.2)
Total Protein: 7.5 g/dL (ref 6.5–8.1)

## 2019-10-11 LAB — TYPE AND SCREEN
ABO/RH(D): A POS
Antibody Screen: NEGATIVE

## 2019-10-11 LAB — TROPONIN I (HIGH SENSITIVITY)
Troponin I (High Sensitivity): 6 ng/L (ref <18)
Troponin I (High Sensitivity): 7 ng/L (ref <18)

## 2019-10-11 LAB — SURGICAL PCR SCREEN
MRSA, PCR: NEGATIVE
Staphylococcus aureus: NEGATIVE

## 2019-10-11 LAB — PROTIME-INR
INR: 1.1 (ref 0.8–1.2)
Prothrombin Time: 14.3 seconds (ref 11.4–15.2)

## 2019-10-11 LAB — RESPIRATORY PANEL BY RT PCR (FLU A&B, COVID)
Influenza A by PCR: NEGATIVE
Influenza B by PCR: NEGATIVE
SARS Coronavirus 2 by RT PCR: POSITIVE — AB

## 2019-10-11 LAB — ABO/RH: ABO/RH(D): A POS

## 2019-10-11 LAB — BRAIN NATRIURETIC PEPTIDE: B Natriuretic Peptide: 139.2 pg/mL — ABNORMAL HIGH (ref 0.0–100.0)

## 2019-10-11 LAB — FERRITIN: Ferritin: 150 ng/mL (ref 11–307)

## 2019-10-11 LAB — LACTATE DEHYDROGENASE: LDH: 573 U/L — ABNORMAL HIGH (ref 98–192)

## 2019-10-11 MED ORDER — PRAVASTATIN SODIUM 10 MG PO TABS
20.0000 mg | ORAL_TABLET | Freq: Every day | ORAL | Status: DC
  Administered 2019-10-12 – 2019-10-19 (×8): 20 mg via ORAL
  Filled 2019-10-11 (×8): qty 2

## 2019-10-11 MED ORDER — CHLORHEXIDINE GLUCONATE 4 % EX LIQD
60.0000 mL | Freq: Once | CUTANEOUS | Status: AC
  Administered 2019-10-12: 4 via TOPICAL
  Filled 2019-10-11 (×2): qty 60

## 2019-10-11 MED ORDER — HEPARIN SODIUM (PORCINE) 5000 UNIT/ML IJ SOLN
5000.0000 [IU] | Freq: Two times a day (BID) | INTRAMUSCULAR | Status: AC
  Administered 2019-10-11: 5000 [IU] via SUBCUTANEOUS
  Filled 2019-10-11: qty 1

## 2019-10-11 MED ORDER — HALOPERIDOL 0.5 MG PO TABS
0.5000 mg | ORAL_TABLET | Freq: Two times a day (BID) | ORAL | Status: DC | PRN
  Filled 2019-10-11: qty 1

## 2019-10-11 MED ORDER — DIVALPROEX SODIUM 125 MG PO CSDR
500.0000 mg | DELAYED_RELEASE_CAPSULE | Freq: Every day | ORAL | Status: DC
  Administered 2019-10-11 – 2019-10-18 (×8): 500 mg via ORAL
  Filled 2019-10-11 (×11): qty 4

## 2019-10-11 MED ORDER — ZINC SULFATE 220 (50 ZN) MG PO CAPS
220.0000 mg | ORAL_CAPSULE | Freq: Every day | ORAL | Status: DC
  Administered 2019-10-12 – 2019-10-19 (×8): 220 mg via ORAL
  Filled 2019-10-11 (×8): qty 1

## 2019-10-11 MED ORDER — TRANEXAMIC ACID-NACL 1000-0.7 MG/100ML-% IV SOLN
1000.0000 mg | INTRAVENOUS | Status: AC
  Administered 2019-10-12: 1000 mg via INTRAVENOUS
  Filled 2019-10-11: qty 100

## 2019-10-11 MED ORDER — ASCORBIC ACID 500 MG PO TABS
500.0000 mg | ORAL_TABLET | Freq: Every day | ORAL | Status: DC
  Administered 2019-10-12 – 2019-10-19 (×8): 500 mg via ORAL
  Filled 2019-10-11 (×8): qty 1

## 2019-10-11 MED ORDER — ACETAMINOPHEN 650 MG RE SUPP: 650.0000 mg | Freq: Four times a day (QID) | RECTAL | Status: DC | PRN

## 2019-10-11 MED ORDER — BENZTROPINE MESYLATE 1 MG PO TABS
1.0000 mg | ORAL_TABLET | Freq: Every day | ORAL | Status: DC
  Administered 2019-10-12 – 2019-10-19 (×8): 1 mg via ORAL
  Filled 2019-10-11 (×8): qty 1

## 2019-10-11 MED ORDER — POVIDONE-IODINE 10 % EX SWAB: 2.0000 "application " | Freq: Once | CUTANEOUS | Status: DC

## 2019-10-11 MED ORDER — SENNOSIDES-DOCUSATE SODIUM 8.6-50 MG PO TABS: 1.0000 | ORAL_TABLET | Freq: Every evening | ORAL | Status: DC | PRN

## 2019-10-11 MED ORDER — ALBUTEROL SULFATE HFA 108 (90 BASE) MCG/ACT IN AERS
2.0000 | INHALATION_SPRAY | Freq: Four times a day (QID) | RESPIRATORY_TRACT | Status: DC
  Administered 2019-10-12 – 2019-10-13 (×6): 2 via RESPIRATORY_TRACT
  Filled 2019-10-11: qty 6.7

## 2019-10-11 MED ORDER — ACETAMINOPHEN 325 MG PO TABS: 650.0000 mg | ORAL_TABLET | Freq: Four times a day (QID) | ORAL | Status: DC | PRN

## 2019-10-11 MED ORDER — CLONAZEPAM 0.5 MG PO TABS
0.5000 mg | ORAL_TABLET | Freq: Two times a day (BID) | ORAL | Status: DC | PRN
  Administered 2019-10-11: 0.5 mg via ORAL
  Filled 2019-10-11: qty 1

## 2019-10-11 MED ORDER — HYDROCHLOROTHIAZIDE 12.5 MG PO CAPS
12.5000 mg | ORAL_CAPSULE | Freq: Every day | ORAL | Status: DC
  Administered 2019-10-12 – 2019-10-16 (×5): 12.5 mg via ORAL
  Filled 2019-10-11 (×6): qty 1

## 2019-10-11 MED ORDER — OXYCODONE HCL 5 MG PO TABS: 5.0000 mg | ORAL_TABLET | ORAL | Status: DC | PRN

## 2019-10-11 MED ORDER — DIVALPROEX SODIUM 125 MG PO CSDR
250.0000 mg | DELAYED_RELEASE_CAPSULE | Freq: Every morning | ORAL | Status: DC
  Administered 2019-10-12 – 2019-10-19 (×8): 250 mg via ORAL
  Filled 2019-10-11 (×8): qty 2

## 2019-10-11 MED ORDER — MUPIROCIN 2 % EX OINT
1.0000 "application " | TOPICAL_OINTMENT | Freq: Two times a day (BID) | CUTANEOUS | Status: AC
  Administered 2019-10-11 – 2019-10-16 (×10): 1 via NASAL
  Filled 2019-10-11 (×5): qty 22

## 2019-10-11 MED ORDER — HALOPERIDOL DECANOATE 50 MG/ML IM SOLN: 150.0000 mg | INTRAMUSCULAR | Status: DC

## 2019-10-11 MED ORDER — TRANEXAMIC ACID 1000 MG/10ML IV SOLN
2000.0000 mg | INTRAVENOUS | Status: AC
  Administered 2019-10-12: 2000 mg via TOPICAL
  Filled 2019-10-11 (×2): qty 20

## 2019-10-11 MED ORDER — CLINDAMYCIN PHOSPHATE 900 MG/50ML IV SOLN: 900.0000 mg | INTRAVENOUS | Status: DC

## 2019-10-11 MED ORDER — ENSURE PRE-SURGERY PO LIQD
296.0000 mL | Freq: Once | ORAL | Status: AC
  Administered 2019-10-12: 296 mL via ORAL
  Filled 2019-10-11: qty 296

## 2019-10-11 MED ORDER — VANCOMYCIN HCL IN DEXTROSE 1-5 GM/200ML-% IV SOLN: 1000.0000 mg | INTRAVENOUS | Status: DC

## 2019-10-11 MED ORDER — FLUOXETINE HCL 20 MG PO CAPS
20.0000 mg | ORAL_CAPSULE | Freq: Every day | ORAL | Status: DC
  Administered 2019-10-12 – 2019-10-19 (×8): 20 mg via ORAL
  Filled 2019-10-11 (×8): qty 1

## 2019-10-11 MED ORDER — GUAIFENESIN-DM 100-10 MG/5ML PO SYRP: 10.0000 mL | ORAL_SOLUTION | ORAL | Status: DC | PRN

## 2019-10-11 MED ORDER — LISINOPRIL 10 MG PO TABS
10.0000 mg | ORAL_TABLET | Freq: Every day | ORAL | Status: DC
  Administered 2019-10-12 – 2019-10-19 (×8): 10 mg via ORAL
  Filled 2019-10-11 (×8): qty 1

## 2019-10-11 MED ORDER — CLINDAMYCIN PHOSPHATE 900 MG/50ML IV SOLN
900.0000 mg | INTRAVENOUS | Status: AC
  Administered 2019-10-12: 900 mg via INTRAVENOUS

## 2019-10-11 MED ORDER — DIVALPROEX SODIUM ER 500 MG PO TB24: 500.0000 mg | ORAL_TABLET | Freq: Every day | ORAL | Status: DC

## 2019-10-11 NOTE — ED Provider Notes (Signed)
[facility] EMERGENCY DEPARTMENT Provider Note   CSN: [id number] Arrival date & time: 10/11/19  1304     History Chief Complaint  Patient presents with  . Fall    Joan Nash is a 74 y.o. female.  Level 5 caveat for dementia.  Patient brought in by EMS from her facility after having a fall this morning.  She apparently was witnessed falling by her roommate and was found lying on the floor for over 1 hour.  She has dementia unable to give a history.  She also has a history of schizophrenia.  She was found to have left hip pain which appears to be shortened externally rotated. Patient does not remember falling.  No blood thinners on her medication list.  The history is provided by the patient.  Fall       Past Medical History:  Diagnosis Date  . Depression   . Diabetes mellitus   . Hypercholesterolemia   . Hypertension   . Schizophrenia [facility])     Patient Active Problem List   Diagnosis Date Noted  . Dementia with behavioral disturbance (HCC) 04/12/2016  . Encounter for preadmission testing   . Schizoaffective disorder (HCC) 01/29/2015    Past Surgical History:  Procedure Laterality Date  . ABDOMINAL HYSTERECTOMY    . TOOTH EXTRACTION       OB History   No obstetric history on file.     No family history on file.  Social History   Tobacco Use  . Smoking status: Never Smoker  . Smokeless tobacco: Never Used  Substance Use Topics  . Alcohol use: No  . Drug use: No    Home Medications Prior to Admission medications   Medication Sig Start Date End Date Taking? Authorizing Provider  benztropine (COGENTIN) 1 MG tablet Take 1 mg by mouth daily.    [provider]  clonazePAM (KLONOPIN) 0.5 MG tablet Take 0.5 mg by mouth 2 (two) times daily as needed for anxiety.    [provider]  divalproex (DEPAKOTE ER) 500 MG 24 hr tablet Take 500 mg by mouth daily.    [provider]  FLUoxetine (PROZAC) 20 MG capsule  Take 20 mg by mouth daily.    [provider]  haloperidol (HALDOL) 0.5 MG tablet Take 0.5 mg by mouth 2 (two) times daily as needed for agitation.    [provider]  haloperidol decanoate (HALDOL DECANOATE) 50 MG/ML injection Inject 150 mg into the muscle every 28 (twenty-eight) days.    [provider]  lisinopril-hydrochlorothiazide (PRINZIDE,ZESTORETIC) 10-12.5 MG tablet Take 1 tablet by mouth daily.    [provider]  pravastatin (PRAVACHOL) 20 MG tablet Take 20 mg by mouth daily.    [provider]    Allergies    Penicillins  Review of Systems   Review of Systems  Unable to perform ROS: Patient nonverbal    Physical Exam Updated Vital Signs BP 119/79 (BP Location: Right Arm)   Pulse 76   Resp 16   SpO2 97%   Physical Exam Vitals and nursing note reviewed.  Constitutional:      General: She is not in acute distress.    Appearance: She is well-developed. She is obese.  HENT:     Head: Normocephalic and atraumatic.     Mouth/Throat:     Pharynx: No oropharyngeal exudate.  Eyes:     Conjunctiva/sclera: Conjunctivae normal.     Pupils: Pupils are equal, round, and reactive to light.  Neck:     Comments: C-spine is nontender, no step-off or deformity Cardiovascular:     Rate and Rhythm: Normal rate and regular rhythm.     Heart sounds: Normal heart sounds. No murmur.  Pulmonary:     Effort: Pulmonary effort is normal. No respiratory distress.     Breath sounds: Normal breath sounds.  Abdominal:     Palpations: Abdomen is soft.     Tenderness: There is no abdominal tenderness. There is no guarding or rebound.  Musculoskeletal:        General: Tenderness and deformity present. Normal range of motion.     Cervical back: Normal range of motion.     Comments: Left leg shortened and externally rotated.  Intact DP and PT pulses  Skin:    General: Skin is warm.  Neurological:     Mental Status: She is alert and oriented to  person, place, and time.     Cranial Nerves: No cranial nerve deficit.     Motor: No abnormal muscle tone.     Coordination: Coordination normal.     Comments:  5/5 strength throughout. CN 2-12 intact.Equal grip strength.   Psychiatric:        Behavior: Behavior normal.     ED Results / Procedures / Treatments   Labs (all labs ordered are listed, but only abnormal results are displayed) Labs Reviewed  COMPREHENSIVE METABOLIC PANEL - Abnormal; Notable for the following components:      Result Value   Sodium 146 (*)    Glucose, Bld 100 (*)    Albumin 3.1 (*)    All other components within normal limits  RESPIRATORY PANEL BY RT PCR (FLU A&B, COVID)  CBC WITH DIFFERENTIAL/PLATELET  PROTIME-INR    EKG EKG Interpretation  Date/Time:  Wednesday October 11 2019 13:49:39 EST Ventricular Rate:  62 PR Interval:    QRS Duration: 98 QT Interval:  410 QTC Calculation: 417 R Axis:   26 Text Interpretation: Interpretation limited secondary to artifact Borderline low voltage, extremity leads No significant change was found Confirmed by Glynn Octave [phone]) on 10/11/2019 2:20:36 PM   Radiology DG Chest 1 View  Result Date: 10/11/2019 CLINICAL DATA:  Fall.  Left leg pain. EXAM: CHEST  1 VIEW COMPARISON:  04/10/2016 FINDINGS: The heart size and mediastinal contours are within normal limits. Both lungs are clear. The visualized skeletal structures are unremarkable. IMPRESSION: No active disease. Electronically Signed   By: Paulina Fusi M.D.   On: 10/11/2019 14:21   CT Head Wo Contrast  Result Date: 10/11/2019 CLINICAL DATA:  Trauma, fall from bed EXAM: CT HEAD WITHOUT CONTRAST CT CERVICAL SPINE WITHOUT CONTRAST TECHNIQUE: Multidetector CT imaging of the head and cervical spine was performed following the standard protocol without intravenous contrast. Multiplanar CT image reconstructions of the cervical spine were also generated. COMPARISON:  None. FINDINGS: CT HEAD FINDINGS Brain: No  evidence of acute infarction, hemorrhage, hydrocephalus, extra-axial collection or mass lesion/mass effect. Periventricular white matter hypodensity. Vascular: No hyperdense vessel or unexpected calcification. Skull: Normal. Negative for fracture or focal lesion. Sinuses/Orbits: No acute finding. Other: None. CT CERVICAL SPINE FINDINGS Alignment: Normal. Skull base and vertebrae: No acute fracture. No primary bone lesion or focal pathologic process. 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 Soft tissues and spinal canal: No prevertebral fluid or swelling. No visible canal hematoma. Disc levels: Moderate disc space height loss and osteophytosis of the lower cervical spine. Upper chest: Negative. Other: None. IMPRESSION: 1. No acute  intracranial pathology. Small-vessel white matter disease. 2. No fracture or static subluxation of the cervical spine. Electronically Signed   By: Eddie Candle M.D.   On: 10/11/2019 15:54   CT Cervical Spine Wo Contrast  Result Date: 10/11/2019 CLINICAL DATA:  Trauma, fall from bed EXAM: CT HEAD WITHOUT CONTRAST CT CERVICAL SPINE WITHOUT CONTRAST TECHNIQUE: Multidetector CT imaging of the head and cervical spine was performed following the standard protocol without intravenous contrast. Multiplanar CT image reconstructions of the cervical spine were also generated. COMPARISON:  None. FINDINGS: CT HEAD FINDINGS Brain: No evidence of acute infarction, hemorrhage, hydrocephalus, extra-axial collection or mass lesion/mass effect. Periventricular white matter hypodensity. Vascular: No hyperdense vessel or unexpected calcification. Skull: Normal. Negative for fracture or focal lesion. Sinuses/Orbits: No acute finding. Other: None. CT CERVICAL SPINE FINDINGS Alignment: Normal. Skull base and vertebrae: No acute fracture. No primary bone lesion or focal pathologic process. 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 Soft tissues and spinal canal: No  prevertebral fluid or swelling. No visible canal hematoma. Disc levels: Moderate disc space height loss and osteophytosis of the lower cervical spine. Upper chest: Negative. Other: None. IMPRESSION: 1. No acute intracranial pathology. Small-vessel white matter disease. 2. No fracture or static subluxation of the cervical spine. Electronically Signed   By: Eddie Candle M.D.   On: 10/11/2019 15:54   DG Hip Unilat W or Wo Pelvis 2-3 Views Left  Result Date: 10/11/2019 CLINICAL DATA:  Left hip pain after fall. EXAM: DG HIP (WITH OR WITHOUT PELVIS) 2-3V LEFT COMPARISON:  None. FINDINGS: Acute displaced fracture of the left femoral neck with varus and apex anterior angulation. No additional fracture. No dislocation. The pubic symphysis and sacroiliac joints are intact. Soft tissues are unremarkable. IMPRESSION: 1. Acute displaced and angulated left femoral neck fracture. Electronically Signed   By: Titus Dubin M.D.   On: 10/11/2019 14:21    Procedures Procedures (including critical care time)  Medications Ordered in ED Medications - No data to display  ED Course  I have reviewed the triage vital signs and the nursing notes.  Pertinent labs & imaging results that were available during my care of the patient were reviewed by me and considered in my medical decision making (see chart for details).    MDM Rules/Calculators/A&P                      Fall with suspected hip fracture.  GCS is 15, ABCs are intact.  Hip fracture confirmed on Xray.  CT head and C spine negative.   Discussed with PA-C Dellis Filbert who will consult on patient and plan for surgery tomorrow. Attempted to contact patient's family and guardian not successful.  Admission discussed with hospitalist Dr. Roosevelt Locks.   Final Clinical Impression(s) / ED Diagnoses Final diagnoses:  Fall, initial encounter  Left displaced femoral neck fracture [facility])    Rx / DC Orders ED Discharge Orders    None       Khalea Ventura, Annie Main,  MD 10/11/19 1643

## 2019-10-11 NOTE — Consult Note (Signed)
Reason for Consult:Left hip fx Referring Physician: S Rancour  Joan Nash is an 74 y.o. female.  HPI: Joan Nash was at the group home where she resides and fell. The circumstances are unclear and her mental status is such that she can't really contribute to history. X-rays showed a left hip fx and orthopedic surgery was consulted.  Past Medical History:  Diagnosis Date  . Depression   . Diabetes mellitus   . Hypercholesterolemia   . Hypertension   . Schizophrenia [facility])     Past Surgical History:  Procedure Laterality Date  . ABDOMINAL HYSTERECTOMY    . TOOTH EXTRACTION      No family history on file.  Social History:  reports that she has never smoked. She has never used smokeless tobacco. She reports that she does not drink alcohol or use drugs.  Allergies:  Allergies  Allergen Reactions  . Penicillins Anaphylaxis    Has patient had a PCN reaction causing immediate rash, facial/tongue/throat swelling, SOB or lightheadedness with hypotension: yes Has patient had a PCN reaction causing severe rash involving mucus membranes or skin necrosis: no Has patient had a PCN reaction that required hospitalization yes Has patient had a PCN reaction occurring within the last 10 years: no If all of the above answers are "NO", then may proceed with Cephalosporin use.     Medications: I have reviewed the patient's current medications.  Results for orders placed or performed during the hospital encounter of 10/11/19 (from the past 48 hour(s))  CBC with Differential/Platelet     Status: None   Collection Time: 10/11/19  2:30 PM  Result Value Ref Range   WBC 5.1 4.0 - 10.5 K/uL   RBC 4.54 3.87 - 5.11 MIL/uL   Hemoglobin 13.5 12.0 - 15.0 g/dL   HCT 42.5 36.0 - 46.0 %   MCV 93.6 80.0 - 100.0 fL   MCH 29.7 26.0 - 34.0 pg   MCHC 31.8 30.0 - 36.0 g/dL   RDW 15.3 11.5 - 15.5 %   Platelets 152 150 - 400 K/uL   nRBC 0.0 0.0 - 0.2 %   Neutrophils Relative % 71 %   Neutro Abs 3.5 1.7 -  7.7 K/uL   Lymphocytes Relative 13 %   Lymphs Abs 0.7 0.7 - 4.0 K/uL   Monocytes Relative 16 %   Monocytes Absolute 0.8 0.1 - 1.0 K/uL   Eosinophils Relative 0 %   Eosinophils Absolute 0.0 0.0 - 0.5 K/uL   Basophils Relative 0 %   Basophils Absolute 0.0 0.0 - 0.1 K/uL   Immature Granulocytes 0 %   Abs Immature Granulocytes 0.01 0.00 - 0.07 K/uL    Comment: Performed at [facility], [address], [place], [place] [address]  Protime-INR     Status: None   Collection Time: 10/11/19  2:30 PM  Result Value Ref Range   Prothrombin Time 14.3 11.4 - 15.2 seconds   INR 1.1 0.8 - 1.2    Comment: (NOTE) INR goal varies based on device and disease states. Performed at [facility], [place] [address] [place], [place] [address]     DG Chest 1 View  Result Date: 10/11/2019 CLINICAL DATA:  Fall.  Left leg pain. EXAM: CHEST  1 VIEW COMPARISON:  04/10/2016 FINDINGS: The heart size and mediastinal contours are within normal limits. Both lungs are clear. The visualized skeletal structures are unremarkable. IMPRESSION: No active disease. Electronically Signed   By: Nelson Chimes M.D.   On:  10/11/2019 14:21   DG Hip Unilat W or Wo Pelvis 2-3 Views Left  Result Date: 10/11/2019 CLINICAL DATA:  Left hip pain after fall. EXAM: DG HIP (WITH OR WITHOUT PELVIS) 2-3V LEFT COMPARISON:  None. FINDINGS: Acute displaced fracture of the left femoral neck with varus and apex anterior angulation. No additional fracture. No dislocation. The pubic symphysis and sacroiliac joints are intact. Soft tissues are unremarkable. IMPRESSION: 1. Acute displaced and angulated left femoral neck fracture. Electronically Signed   By: Obie Dredge M.D.   On: 10/11/2019 14:21    Review of Systems  Unable to perform ROS: Dementia   Blood pressure 119/79, pulse 76, resp. rate 16, SpO2 97 %. Physical Exam  Constitutional: She appears well-developed and well-nourished. No distress.  HENT:  Head: Normocephalic and  atraumatic.  Eyes: Conjunctivae are normal. Right eye exhibits no discharge. Left eye exhibits no discharge. No scleral icterus.  Cardiovascular: Normal rate and regular rhythm.  Respiratory: Effort normal. No respiratory distress.  Musculoskeletal:     Cervical back: Normal range of motion.     Comments: LLE No traumatic wounds, ecchymosis, or rash  Mild TTP hip  No knee or ankle effusion  Knee stable to varus/ valgus and anterior/posterior stress  Sens DPN, SPN, TN intact  Motor EHL, ext, flex, evers 5/5  DP 2+, PT 1, No significant edema  Neurological: She is alert.  Skin: Skin is warm and dry. She is not diaphoretic.  Psychiatric: She has a normal mood and affect. Her behavior is normal.    Assessment/Plan: Left hip fx -- Plan hip hemi tomorrow with Dr. Roda Shutters. Please keep NPO after MN. Multiple medical problems including DM, HTN, HLD, dementia, and schizophrenia -- per medical service who will admit and manage. Appreciate their help.    Freeman Caldron, PA-C Orthopedic Surgery [phone] 10/11/2019, 2:56 PM

## 2019-10-11 NOTE — ED Notes (Signed)
Patient transported to XR. 

## 2019-10-11 NOTE — H&P (Signed)
History and Physical    Joan Nash LFY:[id number] DOB: 05-Dec-1945 DOA: 10/11/2019  PCP: Arnette Felts, FNP   Patient coming from: Home  I have personally briefly reviewed patient's old medical records in [facility]  Chief Complaint: I fell  HPI: Joan Nash is a 74 y.o. female with medical history significant of hypertension, schizophrenia, depression, dementia was group home resident, was brought in for evaluation of fall episode.  Patient claimed that she tripped over her left foot and fell on the left side this morning.  She denied any lightheadedness, no chest pain no short of breath, no leg pain. ED Course: X-ray showed left hip fracture  Review of Systems: Unable to perform patient demented.  Past Medical History:  Diagnosis Date  . Depression   . Diabetes mellitus   . Hypercholesterolemia   . Hypertension   . Schizophrenia [facility])     Past Surgical History:  Procedure Laterality Date  . ABDOMINAL HYSTERECTOMY    . TOOTH EXTRACTION       reports that she has never smoked. She has never used smokeless tobacco. She reports that she does not drink alcohol or use drugs.  Allergies  Allergen Reactions  . Penicillins Anaphylaxis    Has patient had a PCN reaction causing immediate rash, facial/tongue/throat swelling, SOB or lightheadedness with hypotension: yes Has patient had a PCN reaction causing severe rash involving mucus membranes or skin necrosis: no Has patient had a PCN reaction that required hospitalization yes Has patient had a PCN reaction occurring within the last 10 years: no If all of the above answers are "NO", then may proceed with Cephalosporin use.     No family history on file.    Prior to Admission medications   Medication Sig Start Date End Date Taking? Authorizing Provider  amantadine (SYMMETREL) 100 MG capsule Take 100 mg by mouth 2 (two) times daily. 08/28/19  Yes [provider]  benztropine (COGENTIN) 1 MG tablet Take 1  mg by mouth daily.   Yes [provider]  calcium-vitamin D (OSCAL-500) 500-400 MG-UNIT tablet Take 1 tablet by mouth 2 (two) times daily.   Yes [provider]  clonazePAM (KLONOPIN) 0.5 MG tablet Take 0.5 mg by mouth 2 (two) times daily as needed for anxiety.   Yes [provider]  divalproex (DEPAKOTE SPRINKLE) 125 MG capsule Take 250-500 mg by mouth See admin instructions. 250mg  in the morning and 500mg  at bedtime 09/04/19  Yes [provider]  docusate sodium (COLACE) 100 MG capsule Take 100 mg by mouth daily.   Yes [provider]  FLUoxetine (PROZAC) 20 MG capsule Take 20 mg by mouth daily.   Yes [provider]  gabapentin (NEURONTIN) 100 MG capsule Take 100 mg by mouth 2 (two) times daily. 12/14/16  Yes [provider]  haloperidol (HALDOL) 0.5 MG tablet Take 0.5 mg by mouth 2 (two) times a week. Mondays and Wednesdays as needed   Yes [provider]  haloperidol decanoate (HALDOL DECANOATE) 100 MG/ML injection Inject 1 mL into the muscle every 28 (twenty-eight) days. On the 27th of each month 12/14/16  Yes [provider]  lactulose, encephalopathy, (GENERLAC) 10 GM/15ML SOLN Take 20 g by mouth daily as needed (severe contstipation).   Yes [provider]  lisinopril-hydrochlorothiazide (PRINZIDE,ZESTORETIC) 10-12.5 MG tablet Take 1 tablet by mouth daily.   Yes [provider]  OLANZapine (ZYPREXA) 20 MG tablet Take 20 mg by mouth at bedtime. 08/28/19  Yes [provider]  pravastatin (PRAVACHOL) 20 MG tablet Take 20 mg by mouth at bedtime.    Yes [provider]  Psyllium (METAMUCIL PO) Take 1 Dose by mouth daily.   Yes [provider]    Physical Exam: Vitals:   10/11/19 1345 10/11/19 1500 10/11/19 1600 10/11/19 1700  BP: (!) 137/98 138/67 133/75 139/80  Pulse: 65 75 (!) 57 63  Resp: (!) 24 20 13  (!) 25  Temp: 98.5 F (36.9 C)     TempSrc: Oral     SpO2: 98%  100% 100% 100%    Constitutional: NAD, calm, comfortable Vitals:   10/11/19 1345 10/11/19 1500 10/11/19 1600 10/11/19 1700  BP: (!) 137/98 138/67 133/75 139/80  Pulse: 65 75 (!) 57 63  Resp: (!) 24 20 13  (!) 25  Temp: 98.5 F (36.9 C)     TempSrc: Oral     SpO2: 98% 100% 100% 100%   Eyes: PERRL, lids and conjunctivae normal ENMT: Mucous membranes are moist. Posterior pharynx clear of any exudate or lesions.Normal dentition.  Neck: normal, supple, no masses, no thyromegaly Respiratory: clear to auscultation bilaterally, no wheezing, no crackles. Normal respiratory effort. No accessory muscle use.  Cardiovascular: Regular rate and rhythm, no murmurs / rubs / gallops. No extremity edema. 2+ pedal pulses. No carotid bruits.  Abdomen: no tenderness, no masses palpated. No hepatosplenomegaly. Bowel sounds positive.  Musculoskeletal: Left leg shortened and rotated.  Skin: no rashes, lesions, ulcers. No induration Neurologic: Following simple commands, no focal deficit.  Psychiatric: Calm.     Labs on Admission: I have personally reviewed following labs and imaging studies  CBC: Recent Labs  Lab 10/11/19 1430  WBC 5.1  NEUTROABS 3.5  HGB 13.5  HCT 42.5  MCV 93.6  PLT 152   Basic Metabolic Panel: Recent Labs  Lab 10/11/19 1430  NA 146*  K 4.0  CL 109  CO2 27  GLUCOSE 100*  BUN 20  CREATININE 0.66  CALCIUM 9.7   GFR: CrCl cannot be calculated (Unknown ideal weight.). Liver Function Tests: Recent Labs  Lab 10/11/19 1430  AST 38  ALT 27  ALKPHOS 38  BILITOT 0.7  PROT 7.5  ALBUMIN 3.1*   No results for input(s): LIPASE, AMYLASE in the last 168 hours. No results for input(s): AMMONIA in the last 168 hours. Coagulation Profile: Recent Labs  Lab 10/11/19 1430  INR 1.1   Cardiac Enzymes: No results for input(s): CKTOTAL, CKMB, CKMBINDEX, TROPONINI in the last 168 hours. BNP (last 3 results) No results for input(s): PROBNP in the last 8760 hours. HbA1C: No  results for input(s): HGBA1C in the last 72 hours. CBG: No results for input(s): GLUCAP in the last 168 hours. Lipid Profile: No results for input(s): CHOL, HDL, LDLCALC, TRIG, CHOLHDL, LDLDIRECT in the last 72 hours. Thyroid Function Tests: No results for input(s): TSH, T4TOTAL, FREET4, T3FREE, THYROIDAB in the last 72 hours. Anemia Panel: No results for input(s): VITAMINB12, FOLATE, FERRITIN, TIBC, IRON, RETICCTPCT in the last 72 hours. Urine analysis:    Component Value Date/Time   COLORURINE YELLOW 04/10/2016 0058   APPEARANCEUR CLEAR 04/10/2016 0058   LABSPEC 1.014 04/10/2016 0058   PHURINE 7.0 04/10/2016 0058   GLUCOSEU NEGATIVE 04/10/2016 0058   HGBUR NEGATIVE 04/10/2016 0058   BILIRUBINUR NEGATIVE 04/10/2016 0058   KETONESUR NEGATIVE 04/10/2016 0058   PROTEINUR NEGATIVE 04/10/2016 0058   UROBILINOGEN 0.2 05/21/2015 1101   NITRITE NEGATIVE 04/10/2016 0058   LEUKOCYTESUR LARGE (A) 04/10/2016 0058    Radiological Exams on  Admission: DG Chest 1 View  Result Date: 10/11/2019 CLINICAL DATA:  Fall.  Left leg pain. EXAM: CHEST  1 VIEW COMPARISON:  04/10/2016 FINDINGS: The heart size and mediastinal contours are within normal limits. Both lungs are clear. The visualized skeletal structures are unremarkable. IMPRESSION: No active disease. Electronically Signed   By: Paulina Fusi M.D.   On: 10/11/2019 14:21   CT Head Wo Contrast  Result Date: 10/11/2019 CLINICAL DATA:  Trauma, fall from bed EXAM: CT HEAD WITHOUT CONTRAST CT CERVICAL SPINE WITHOUT CONTRAST TECHNIQUE: Multidetector CT imaging of the head and cervical spine was performed following the standard protocol without intravenous contrast. Multiplanar CT image reconstructions of the cervical spine were also generated. COMPARISON:  None. FINDINGS: CT HEAD FINDINGS Brain: No evidence of acute infarction, hemorrhage, hydrocephalus, extra-axial collection or mass lesion/mass effect. Periventricular white matter hypodensity. Vascular:  No hyperdense vessel or unexpected calcification. Skull: Normal. Negative for fracture or focal lesion. Sinuses/Orbits: No acute finding. Other: None. CT CERVICAL SPINE FINDINGS Alignment: Normal. Skull base and vertebrae: No acute fracture. No primary bone lesion or focal pathologic process. 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 Soft tissues and spinal canal: No prevertebral fluid or swelling. No visible canal hematoma. Disc levels: Moderate disc space height loss and osteophytosis of the lower cervical spine. Upper chest: Negative. Other: None. IMPRESSION: 1. No acute intracranial pathology. Small-vessel white matter disease. 2. No fracture or static subluxation of the cervical spine. Electronically Signed   By: Lauralyn Primes M.D.   On: 10/11/2019 15:54   CT Cervical Spine Wo Contrast  Result Date: 10/11/2019 CLINICAL DATA:  Trauma, fall from bed EXAM: CT HEAD WITHOUT CONTRAST CT CERVICAL SPINE WITHOUT CONTRAST TECHNIQUE: Multidetector CT imaging of the head and cervical spine was performed following the standard protocol without intravenous contrast. Multiplanar CT image reconstructions of the cervical spine were also generated. COMPARISON:  None. FINDINGS: CT HEAD FINDINGS Brain: No evidence of acute infarction, hemorrhage, hydrocephalus, extra-axial collection or mass lesion/mass effect. Periventricular white matter hypodensity. Vascular: No hyperdense vessel or unexpected calcification. Skull: Normal. Negative for fracture or focal lesion. Sinuses/Orbits: No acute finding. Other: None. CT CERVICAL SPINE FINDINGS Alignment: Normal. Skull base and vertebrae: No acute fracture. No primary bone lesion or focal pathologic process. 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 Soft tissues and spinal canal: No prevertebral fluid or swelling. No visible canal hematoma. Disc levels: Moderate disc space height loss and osteophytosis of the lower cervical spine. Upper  chest: Negative. Other: None. IMPRESSION: 1. No acute intracranial pathology. Small-vessel white matter disease. 2. No fracture or static subluxation of the cervical spine. Electronically Signed   By: Lauralyn Primes M.D.   On: 10/11/2019 15:54   DG Hip Unilat W or Wo Pelvis 2-3 Views Left  Result Date: 10/11/2019 CLINICAL DATA:  Left hip pain after fall. EXAM: DG HIP (WITH OR WITHOUT PELVIS) 2-3V LEFT COMPARISON:  None. FINDINGS: Acute displaced fracture of the left femoral neck with varus and apex anterior angulation. No additional fracture. No dislocation. The pubic symphysis and sacroiliac joints are intact. Soft tissues are unremarkable. IMPRESSION: 1. Acute displaced and angulated left femoral neck fracture. Electronically Signed   By: Obie Dredge M.D.   On: 10/11/2019 14:21    EKG: Independently reviewed.   Assessment/Plan Active Problems:   Hip fracture, unspecified laterality, closed, initial encounter (HCC)   Closed right hip fracture, initial encounter [facility])   Hip fracture requiring operative repair, left, closed, initial encounter (  [place])  Left femoral neck fracture closed, secondary to mechanical fall Pain management, n.p.o. after midnight Orthopedic surgery will take patient to or tomorrow to have ORIF. PT evaluation after OR. Heparin subcu for DVT prophylaxis.  Asymptomatic COVID-19 infection, she has no cough no hypoxia, x-ray showed no significant infiltrates, will hold off aggressive treatment.  Schizophrenia, fairly controlled, continue antipsychiatric medications.  Dementia, mental status looks like at her baseline.  HTN, continue home meds.    DVT prophylaxis: Heparin subcu Code Status: Full code Family Communication: Talked to her guardian Luisa Dago over the phone. Disposition Plan: Rehab versus SNF Consults called: Orthopedic surgery Admission status: MedSurg   Lequita Halt MD [facility] Pager [phone]  If 7PM-7AM, please contact  night-coverage [email or web address] Password [facility]  10/11/2019, 7:05 PM

## 2019-10-11 NOTE — ED Triage Notes (Signed)
Pt arrives via EMS from [facility] after a fall from bed this was witness by room mate- per ems room mate states pt laid on the floor on her abdomen for over 1 hour. Pt arrives in collar. Pt has hx of dementia alert only to person. Pt reports left hip pain.

## 2019-10-12 ENCOUNTER — Inpatient Hospital Stay (HOSPITAL_COMMUNITY): Payer: Medicare Other | Admitting: Registered Nurse

## 2019-10-12 ENCOUNTER — Encounter (HOSPITAL_COMMUNITY)
Admission: EM | Disposition: A | Discharge status: Skilled Nursing Facility | Payer: Self-pay | Source: Home / Self Care | Attending: Internal Medicine

## 2019-10-12 ENCOUNTER — Telehealth: Payer: Self-pay | Admitting: Orthopaedic Surgery

## 2019-10-12 ENCOUNTER — Inpatient Hospital Stay (HOSPITAL_COMMUNITY): Payer: Medicare Other

## 2019-10-12 DIAGNOSIS — S72002A Fracture of unspecified part of neck of left femur, initial encounter for closed fracture: Principal | ICD-10-CM

## 2019-10-12 DIAGNOSIS — F0391 Unspecified dementia with behavioral disturbance: Secondary | ICD-10-CM

## 2019-10-12 DIAGNOSIS — F209 Schizophrenia, unspecified: Secondary | ICD-10-CM

## 2019-10-12 DIAGNOSIS — I10 Essential (primary) hypertension: Secondary | ICD-10-CM

## 2019-10-12 HISTORY — PX: ANTERIOR APPROACH HEMI HIP ARTHROPLASTY: SHX6690

## 2019-10-12 LAB — CBC
HCT: 38.9 % (ref 36.0–46.0)
HCT: 43.6 % (ref 36.0–46.0)
Hemoglobin: 12.5 g/dL (ref 12.0–15.0)
Hemoglobin: 13.7 g/dL (ref 12.0–15.0)
MCH: 29.5 pg (ref 26.0–34.0)
MCH: 29.1 pg (ref 26.0–34.0)
MCHC: 32.1 g/dL (ref 30.0–36.0)
MCHC: 31.4 g/dL (ref 30.0–36.0)
MCV: 91.7 fL (ref 80.0–100.0)
MCV: 92.6 fL (ref 80.0–100.0)
Platelets: 146 10*3/uL — ABNORMAL LOW (ref 150–400)
Platelets: 163 10*3/uL (ref 150–400)
RBC: 4.24 MIL/uL (ref 3.87–5.11)
RBC: 4.71 MIL/uL (ref 3.87–5.11)
RDW: 15.2 % (ref 11.5–15.5)
RDW: 14.9 % (ref 11.5–15.5)
WBC: 4.6 10*3/uL (ref 4.0–10.5)
WBC: 8 10*3/uL (ref 4.0–10.5)
nRBC: 0.4 % — ABNORMAL HIGH (ref 0.0–0.2)
nRBC: 0 % (ref 0.0–0.2)

## 2019-10-12 LAB — COMPREHENSIVE METABOLIC PANEL
ALT: 26 U/L (ref 0–44)
AST: 27 U/L (ref 15–41)
Albumin: 2.6 g/dL — ABNORMAL LOW (ref 3.5–5.0)
Alkaline Phosphatase: 35 U/L — ABNORMAL LOW (ref 38–126)
Anion gap: 9 (ref 5–15)
BUN: 21 mg/dL (ref 8–23)
CO2: 29 mmol/L (ref 22–32)
Calcium: 9.4 mg/dL (ref 8.9–10.3)
Chloride: 108 mmol/L (ref 98–111)
Creatinine, Ser: 0.6 mg/dL (ref 0.44–1.00)
GFR calc Af Amer: >60 mL/min (ref >60)
GFR calc non Af Amer: >60 mL/min (ref >60)
Glucose, Bld: 102 mg/dL — ABNORMAL HIGH (ref 70–99)
Potassium: 3.4 mmol/L — ABNORMAL LOW (ref 3.5–5.1)
Sodium: 146 mmol/L — ABNORMAL HIGH (ref 135–145)
Total Bilirubin: 0.7 mg/dL (ref 0.3–1.2)
Total Protein: 6.7 g/dL (ref 6.5–8.1)

## 2019-10-12 LAB — CREATININE, SERUM
Creatinine, Ser: 0.64 mg/dL (ref 0.44–1.00)
GFR calc Af Amer: >60 mL/min (ref >60)
GFR calc non Af Amer: >60 mL/min (ref >60)

## 2019-10-12 LAB — PHOSPHORUS: Phosphorus: 4 mg/dL (ref 2.5–4.6)

## 2019-10-12 LAB — MAGNESIUM: Magnesium: 1.9 mg/dL (ref 1.7–2.4)

## 2019-10-12 LAB — GLUCOSE, CAPILLARY: Glucose-Capillary: 117 mg/dL — ABNORMAL HIGH (ref 70–99)

## 2019-10-12 LAB — D-DIMER, QUANTITATIVE: D-Dimer, Quant: 7.37 ug/mL-FEU — ABNORMAL HIGH (ref 0.00–0.50)

## 2019-10-12 LAB — FERRITIN: Ferritin: 226 ng/mL (ref 11–307)

## 2019-10-12 LAB — C-REACTIVE PROTEIN: CRP: 14 mg/dL — ABNORMAL HIGH (ref <1.0)

## 2019-10-12 SURGERY — HEMIARTHROPLASTY, HIP, DIRECT ANTERIOR APPROACH, FOR FRACTURE
Anesthesia: Monitor Anesthesia Care | Laterality: Left

## 2019-10-12 MED ORDER — POLYETHYLENE GLYCOL 3350 17 G PO PACK: 17.0000 g | PACK | Freq: Every day | ORAL | Status: DC | PRN

## 2019-10-12 MED ORDER — BUPIVACAINE IN DEXTROSE 0.75-8.25 % IT SOLN
INTRATHECAL | Status: DC | PRN
  Administered 2019-10-12: 2 mL via INTRATHECAL

## 2019-10-12 MED ORDER — SODIUM CHLORIDE 0.9 % IR SOLN
Status: DC | PRN
  Administered 2019-10-12: 3000 mL

## 2019-10-12 MED ORDER — VANCOMYCIN HCL 1 G IV SOLR
INTRAVENOUS | Status: DC | PRN
  Administered 2019-10-12: 1000 mg via TOPICAL

## 2019-10-12 MED ORDER — HYDROCODONE-ACETAMINOPHEN 5-325 MG PO TABS: 1.0000 | ORAL_TABLET | Freq: Three times a day (TID) | ORAL | 0 refills | Status: DC | PRN

## 2019-10-12 MED ORDER — SODIUM CHLORIDE 0.9 % IV SOLN: INTRAVENOUS | Status: DC

## 2019-10-12 MED ORDER — PROPOFOL 500 MG/50ML IV EMUL
INTRAVENOUS | Status: DC | PRN
  Administered 2019-10-12: 25 ug/kg/min via INTRAVENOUS

## 2019-10-12 MED ORDER — MORPHINE SULFATE (PF) 2 MG/ML IV SOLN: 1.0000 mg | INTRAVENOUS | Status: DC | PRN

## 2019-10-12 MED ORDER — METHOCARBAMOL 500 MG PO TABS: 500.0000 mg | ORAL_TABLET | Freq: Four times a day (QID) | ORAL | Status: DC | PRN

## 2019-10-12 MED ORDER — GABAPENTIN 300 MG PO CAPS
300.0000 mg | ORAL_CAPSULE | Freq: Three times a day (TID) | ORAL | Status: DC
  Administered 2019-10-12 – 2019-10-19 (×20): 300 mg via ORAL
  Filled 2019-10-12 (×20): qty 1

## 2019-10-12 MED ORDER — LIP MEDEX EX OINT
TOPICAL_OINTMENT | CUTANEOUS | Status: DC | PRN
  Administered 2019-10-12: 1 via TOPICAL
  Filled 2019-10-12: qty 7

## 2019-10-12 MED ORDER — DOCUSATE SODIUM 100 MG PO CAPS
100.0000 mg | ORAL_CAPSULE | Freq: Two times a day (BID) | ORAL | Status: DC
  Administered 2019-10-12 – 2019-10-19 (×14): 100 mg via ORAL
  Filled 2019-10-12 (×14): qty 1

## 2019-10-12 MED ORDER — PROPOFOL 10 MG/ML IV BOLUS
INTRAVENOUS | Status: DC | PRN
  Administered 2019-10-12: 50 mg via INTRAVENOUS
  Administered 2019-10-12: 20 mg via INTRAVENOUS

## 2019-10-12 MED ORDER — ENOXAPARIN SODIUM 40 MG/0.4ML ~~LOC~~ SOLN
40.0000 mg | SUBCUTANEOUS | Status: DC
  Administered 2019-10-13 – 2019-10-17 (×5): 40 mg via SUBCUTANEOUS
  Filled 2019-10-12 (×5): qty 0.4

## 2019-10-12 MED ORDER — ONDANSETRON HCL 4 MG PO TABS: 4.0000 mg | ORAL_TABLET | Freq: Four times a day (QID) | ORAL | Status: DC | PRN

## 2019-10-12 MED ORDER — ENOXAPARIN SODIUM 40 MG/0.4ML ~~LOC~~ SOLN: 40.0000 mg | Freq: Every day | SUBCUTANEOUS | 13 refills | Status: DC

## 2019-10-12 MED ORDER — METHOCARBAMOL 1000 MG/10ML IJ SOLN
500.0000 mg | Freq: Four times a day (QID) | INTRAVENOUS | Status: DC | PRN
  Filled 2019-10-12: qty 5

## 2019-10-12 MED ORDER — ACETAMINOPHEN 500 MG PO TABS
500.0000 mg | ORAL_TABLET | Freq: Four times a day (QID) | ORAL | Status: AC
  Administered 2019-10-12 – 2019-10-13 (×2): 500 mg via ORAL
  Filled 2019-10-12 (×2): qty 1

## 2019-10-12 MED ORDER — ONDANSETRON HCL 4 MG/2ML IJ SOLN: 4.0000 mg | Freq: Four times a day (QID) | INTRAMUSCULAR | Status: DC | PRN

## 2019-10-12 MED ORDER — HYDROCODONE-ACETAMINOPHEN 5-325 MG PO TABS
1.0000 | ORAL_TABLET | ORAL | Status: DC | PRN
  Administered 2019-10-12 – 2019-10-13 (×2): 1 via ORAL
  Filled 2019-10-12 (×2): qty 1

## 2019-10-12 MED ORDER — ONDANSETRON HCL 4 MG/2ML IJ SOLN: 4.0000 mg | Freq: Once | INTRAMUSCULAR | Status: DC | PRN

## 2019-10-12 MED ORDER — PHENYLEPHRINE 40 MCG/ML (10ML) SYRINGE FOR IV PUSH (FOR BLOOD PRESSURE SUPPORT)
PREFILLED_SYRINGE | INTRAVENOUS | Status: DC | PRN
  Administered 2019-10-12: 80 ug via INTRAVENOUS
  Administered 2019-10-12: 120 ug via INTRAVENOUS

## 2019-10-12 MED ORDER — SORBITOL 70 % SOLN
30.0000 mL | Freq: Every day | Status: DC | PRN
  Filled 2019-10-12: qty 30

## 2019-10-12 MED ORDER — PROPOFOL 10 MG/ML IV BOLUS
INTRAVENOUS | Status: AC
  Filled 2019-10-12: qty 20

## 2019-10-12 MED ORDER — ALUM & MAG HYDROXIDE-SIMETH 200-200-20 MG/5ML PO SUSP: 30.0000 mL | ORAL | Status: DC | PRN

## 2019-10-12 MED ORDER — MEPERIDINE HCL 25 MG/ML IJ SOLN: 6.2500 mg | INTRAMUSCULAR | Status: DC | PRN

## 2019-10-12 MED ORDER — MAGNESIUM CITRATE PO SOLN: 1.0000 | Freq: Once | ORAL | Status: DC | PRN

## 2019-10-12 MED ORDER — DEXAMETHASONE SODIUM PHOSPHATE 10 MG/ML IJ SOLN
INTRAMUSCULAR | Status: DC | PRN
  Administered 2019-10-12: 5 mg via INTRAVENOUS

## 2019-10-12 MED ORDER — ACETAMINOPHEN 325 MG PO TABS: 325.0000 mg | ORAL_TABLET | Freq: Four times a day (QID) | ORAL | Status: DC | PRN

## 2019-10-12 MED ORDER — CLINDAMYCIN PHOSPHATE 600 MG/50ML IV SOLN
600.0000 mg | Freq: Four times a day (QID) | INTRAVENOUS | Status: AC
  Administered 2019-10-12 – 2019-10-13 (×2): 600 mg via INTRAVENOUS
  Filled 2019-10-12 (×2): qty 50

## 2019-10-12 MED ORDER — FENTANYL CITRATE (PF) 250 MCG/5ML IJ SOLN
INTRAMUSCULAR | Status: AC
  Filled 2019-10-12: qty 5

## 2019-10-12 MED ORDER — ONDANSETRON HCL 4 MG/2ML IJ SOLN
INTRAMUSCULAR | Status: DC | PRN
  Administered 2019-10-12: 4 mg via INTRAVENOUS

## 2019-10-12 MED ORDER — PHENYLEPHRINE HCL-NACL 10-0.9 MG/250ML-% IV SOLN
INTRAVENOUS | Status: DC | PRN
  Administered 2019-10-12: 35 ug/min via INTRAVENOUS

## 2019-10-12 MED ORDER — MENTHOL 3 MG MT LOZG: 1.0000 | LOZENGE | OROMUCOSAL | Status: DC | PRN

## 2019-10-12 MED ORDER — TRANEXAMIC ACID-NACL 1000-0.7 MG/100ML-% IV SOLN
INTRAVENOUS | Status: AC
  Filled 2019-10-12: qty 100

## 2019-10-12 MED ORDER — VANCOMYCIN HCL 1000 MG IV SOLR
INTRAVENOUS | Status: AC
  Filled 2019-10-12: qty 1000

## 2019-10-12 MED ORDER — 0.9 % SODIUM CHLORIDE (POUR BTL) OPTIME
TOPICAL | Status: DC | PRN
  Administered 2019-10-12: 1000 mL

## 2019-10-12 MED ORDER — FENTANYL CITRATE (PF) 100 MCG/2ML IJ SOLN
INTRAMUSCULAR | Status: DC | PRN
  Administered 2019-10-12: 25 ug via INTRAVENOUS

## 2019-10-12 MED ORDER — LACTATED RINGERS IV SOLN: INTRAVENOUS | Status: DC | PRN

## 2019-10-12 MED ORDER — FENTANYL CITRATE (PF) 100 MCG/2ML IJ SOLN: 25.0000 ug | INTRAMUSCULAR | Status: DC | PRN

## 2019-10-12 MED ORDER — HYDROCODONE-ACETAMINOPHEN 7.5-325 MG PO TABS: 1.0000 | ORAL_TABLET | ORAL | Status: DC | PRN

## 2019-10-12 MED ORDER — PHENOL 1.4 % MT LIQD: 1.0000 | OROMUCOSAL | Status: DC | PRN

## 2019-10-12 SURGICAL SUPPLY — 60 items
BAG DECANTER FOR FLEXI CONT (MISCELLANEOUS) ×3 IMPLANT
BIPOLAR DEPUY 49 (Hips) ×2 IMPLANT
BIPOLAR DEPUY 49MM (Hips) ×1 IMPLANT
CELLS DAT CNTRL 66122 CELL SVR (MISCELLANEOUS) ×1 IMPLANT
COVER PERINEAL POST (MISCELLANEOUS) ×3 IMPLANT
COVER SURGICAL LIGHT HANDLE (MISCELLANEOUS) ×3 IMPLANT
COVER WAND RF STERILE (DRAPES) ×3 IMPLANT
DRAPE C-ARM 42X72 X-RAY (DRAPES) ×3 IMPLANT
DRAPE POUCH INSTRU U-SHP 10X18 (DRAPES) ×3 IMPLANT
DRAPE STERI IOBAN 125X83 (DRAPES) ×3 IMPLANT
DRAPE U-SHAPE 47X51 STRL (DRAPES) ×6 IMPLANT
DRSG AQUACEL AG ADV 3.5X10 (GAUZE/BANDAGES/DRESSINGS) ×3 IMPLANT
DURAPREP 26ML APPLICATOR (WOUND CARE) ×6 IMPLANT
ELECT BLADE 4.0 EZ CLEAN MEGAD (MISCELLANEOUS) ×3
ELECT REM PT RETURN 9FT ADLT (ELECTROSURGICAL) ×3
ELECTRODE BLDE 4.0 EZ CLN MEGD (MISCELLANEOUS) ×1 IMPLANT
ELECTRODE REM PT RTRN 9FT ADLT (ELECTROSURGICAL) ×1 IMPLANT
GLOVE BIOGEL PI IND STRL 7.0 (GLOVE) ×1 IMPLANT
GLOVE BIOGEL PI INDICATOR 7.0 (GLOVE) ×2
GLOVE ECLIPSE 7.0 STRL STRAW (GLOVE) ×6 IMPLANT
GLOVE SKINSENSE NS SZ7.5 (GLOVE) ×2
GLOVE SKINSENSE STRL SZ7.5 (GLOVE) ×1 IMPLANT
GLOVE SURG SYN 7.5  E (GLOVE) ×8
GLOVE SURG SYN 7.5 E (GLOVE) ×4 IMPLANT
GLOVE SURG SYN 7.5 PF PI (GLOVE) ×4 IMPLANT
GOWN STRL REIN XL XLG (GOWN DISPOSABLE) ×3 IMPLANT
GOWN STRL REUS W/ TWL LRG LVL3 (GOWN DISPOSABLE) IMPLANT
GOWN STRL REUS W/ TWL XL LVL3 (GOWN DISPOSABLE) ×1 IMPLANT
GOWN STRL REUS W/TWL LRG LVL3 (GOWN DISPOSABLE)
GOWN STRL REUS W/TWL XL LVL3 (GOWN DISPOSABLE) ×3
HANDPIECE INTERPULSE COAX TIP (DISPOSABLE) ×3
HEAD BIPOLAR DEPUY 49 (Hips) IMPLANT
HEAD FEM STD 28X+12 (Hips) ×2 IMPLANT
HOOD PEEL AWAY FLYTE STAYCOOL (MISCELLANEOUS) ×6 IMPLANT
IV NS IRRIG 3000ML ARTHROMATIC (IV SOLUTION) ×3 IMPLANT
KIT BASIN OR (CUSTOM PROCEDURE TRAY) ×3 IMPLANT
MARKER SKIN DUAL TIP RULER LAB (MISCELLANEOUS) ×3 IMPLANT
NDL SPNL 18GX3.5 QUINCKE PK (NEEDLE) ×1 IMPLANT
NEEDLE SPNL 18GX3.5 QUINCKE PK (NEEDLE) ×3 IMPLANT
PACK TOTAL JOINT (CUSTOM PROCEDURE TRAY) ×3 IMPLANT
PACK UNIVERSAL I (CUSTOM PROCEDURE TRAY) ×3 IMPLANT
RETRACTOR WND ALEXIS 18 MED (MISCELLANEOUS) IMPLANT
RTRCTR WOUND ALEXIS 18CM MED (MISCELLANEOUS) ×3
SAW OSC TIP CART 19.5X105X1.3 (SAW) ×3 IMPLANT
SET HNDPC FAN SPRY TIP SCT (DISPOSABLE) ×1 IMPLANT
STAPLER VISISTAT 35W (STAPLE) IMPLANT
STEM FEM ACTIS STD SZ4 (Stem) ×2 IMPLANT
SUT ETHIBOND 2 V 37 (SUTURE) ×3 IMPLANT
SUT VIC AB 0 CT1 27 (SUTURE) ×3
SUT VIC AB 0 CT1 27XBRD ANBCTR (SUTURE) ×1 IMPLANT
SUT VIC AB 1 CTX 36 (SUTURE) ×3
SUT VIC AB 1 CTX36XBRD ANBCTR (SUTURE) ×1 IMPLANT
SUT VIC AB 2-0 CT1 27 (SUTURE) ×6
SUT VIC AB 2-0 CT1 TAPERPNT 27 (SUTURE) ×2 IMPLANT
SYR 50ML LL SCALE MARK (SYRINGE) ×3 IMPLANT
TOWEL GREEN STERILE (TOWEL DISPOSABLE) ×3 IMPLANT
TRAY CATH 16FR W/PLASTIC CATH (SET/KITS/TRAYS/PACK) IMPLANT
TRAY FOLEY W/BAG SLVR 16FR (SET/KITS/TRAYS/PACK) ×3
TRAY FOLEY W/BAG SLVR 16FR ST (SET/KITS/TRAYS/PACK) ×1 IMPLANT
YANKAUER SUCT BULB TIP NO VENT (SUCTIONS) ×3 IMPLANT

## 2019-10-12 NOTE — Transfer of Care (Signed)
Immediate Anesthesia Transfer of Care Note  Patient: Joan Nash  Procedure(s) Performed: ANTERIOR APPROACH HEMI HIP ARTHROPLASTY (Left )  Patient Location: PACU and PACU nurse in OR 4 to recover patient  Anesthesia Type:Spinal  Level of Consciousness: awake  Airway & Oxygen Therapy: Patient Spontanous Breathing and Patient connected to face mask oxygen  Post-op Assessment: Report given to RN and Post -op Vital signs reviewed and stable  Post vital signs: Reviewed and stable  Last Vitals:  Vitals Value Taken Time  BP    Temp    Pulse    Resp    SpO2      Last Pain:  Vitals:   10/11/19 2005  TempSrc: Oral  PainSc: 0-No pain      Patients Stated Pain Goal: 0 (10/11/19 2005)  Complications: No apparent anesthesia complications

## 2019-10-12 NOTE — Telephone Encounter (Signed)
Spoke to her.

## 2019-10-12 NOTE — Anesthesia Preprocedure Evaluation (Addendum)
Anesthesia Evaluation  Patient identified by MRN, date of birth, ID band Patient awake    Reviewed: Allergy & Precautions, NPO status , Patient's Chart, lab work & pertinent test results  Airway Mallampati: II  TM Distance: >3 FB Neck ROM: Full    Dental  (+) Dental Advisory Given   Pulmonary neg pulmonary ROS,  10/11/2019 SARS coronavirus POSITIVE   Pulmonary exam normal breath sounds clear to auscultation       Cardiovascular hypertension, Pt. on medications Normal cardiovascular exam Rhythm:Regular Rate:Normal     Neuro/Psych PSYCHIATRIC DISORDERS Depression Schizophrenia Dementia negative neurological ROS     GI/Hepatic negative GI ROS, Neg liver ROS,   Endo/Other  diabetes  Renal/GU negative Renal ROS     Musculoskeletal negative musculoskeletal ROS (+)   Abdominal   Peds  Hematology negative hematology ROS (+)   Anesthesia Other Findings   Reproductive/Obstetrics negative OB ROS                            Anesthesia Physical Anesthesia Plan  ASA: IV  Anesthesia Plan:    Post-op Pain Management:    Induction:   PONV Risk Score and Plan: 2  Airway Management Planned:   Additional Equipment:   Intra-op Plan:   Post-operative Plan:   Informed Consent: I have reviewed the patients History and Physical, chart, labs and discussed the procedure including the risks, benefits and alternatives for the proposed anesthesia with the patient or authorized representative who has indicated his/her understanding and acceptance.     Dental advisory given  Plan Discussed with: CRNA  Anesthesia Plan Comments:       Anesthesia Quick Evaluation

## 2019-10-12 NOTE — Anesthesia Postprocedure Evaluation (Signed)
Anesthesia Post Note  Patient: Joan Nash  Procedure(s) Performed: ANTERIOR APPROACH HEMI HIP ARTHROPLASTY (Left )     Patient location during evaluation: PACU Anesthesia Type: Spinal Level of consciousness: awake and alert Pain management: pain level controlled Vital Signs Assessment: post-procedure vital signs reviewed and stable Respiratory status: spontaneous breathing Cardiovascular status: stable Anesthetic complications: no    Last Vitals:  Vitals:   10/12/19 1700 10/12/19 1724  BP: (!) 140/51 138/67  Pulse:    Resp:  17  Temp: 36.8 C 36.9 C  SpO2:  100%    Last Pain:  Vitals:   10/12/19 1800  TempSrc:   PainSc: Asleep                 Lewie Loron

## 2019-10-12 NOTE — Progress Notes (Signed)
[address]west

## 2019-10-12 NOTE — Op Note (Signed)
ANTERIOR APPROACH HEMI HIP ARTHROPLASTY  Procedure Note Joan Nash   [id number]  Pre-op Diagnosis: left hip fracture     Post-op Diagnosis: same   Operative Procedures  1. Prosthetic replacement for femoral neck fracture. CPT [phone]  Personnel  Surgeon(s): Leandrew Koyanagi, MD  ASSIST: Madalyn Rob, PA-C; necessary for the timely completion of procedure and due to complexity of procedure.   Anesthesia: spinal  Prosthesis: Depuy Femur: Actis 4 STD Head: 49 mm size: +12.5 Bearing Type: bipolar  Hip Hemiarthroplasty (Anterior Approach) Op Note:  After informed consent was obtained and the operative extremity marked in the holding area, the patient was brought back to the operating room and placed supine on the HANA table. Next, the operative extremity was prepped and draped in normal sterile fashion. Surgical timeout occurred verifying patient identification, surgical site, surgical procedure and administration of antibiotics.  A modified anterior Smith-Peterson approach to the hip was performed, using the interval between tensor fascia lata and sartorius.  Dissection was carried bluntly down onto the anterior hip capsule. The lateral femoral circumflex vessels were identified and coagulated. A capsulotomy was performed and the capsular flaps tagged for later repair.  The neck osteotomy was performed. The femoral head was removed and found a 49 mm head was the appropriate fit.    We then turned our attention to the femur.  After placing the femoral hook, the leg was taken to externally rotated, extended and adducted position taking care to perform soft tissue releases to allow for adequate mobilization of the femur. Soft tissue was cleared from the shoulder of the greater trochanter and the hook elevator used to improve exposure of the proximal femur. Sequential broaching performed up to a size 4. Trial neck and head were placed. The leg was brought back up to neutral and the  construct reduced. The position and sizing of components, offset and leg lengths were checked using fluoroscopy. Stability of the construct was checked in extension and external rotation without any subluxation or impingement of prosthesis. We dislocated the prosthesis, dropped the leg back into position, removed trial components, and irrigated copiously. The final stem and head was then placed, the leg brought back up, the system reduced and fluoroscopy used to verify positioning.  We irrigated, obtained hemostasis and closed the capsule using #2 ethibond suture.  The fascia was closed with #1 vicryl plus, the deep fat layer was closed with 0 vicryl, the subcutaneous layers closed with 2.0 Vicryl Plus and the skin closed with staples. A sterile dressing was applied. The patient was awakened in the operating room and taken to recovery in stable condition. All sponge, needle, and instrument counts were correct at the end of the case.   Position: supine  Complications: see description of procedure.  Time Out: performed   Drains/Packing: none  Estimated blood loss: see anesthesia record  Returned to Recovery Room: in good condition.   Antibiotics: yes   Mechanical VTE (DVT) Prophylaxis: sequential compression devices, TED thigh-high  Chemical VTE (DVT) Prophylaxis: lovenox  Fluid Replacement: Crystalloid: see anesthesia record  Specimens Removed: 1 to pathology   Sponge and Instrument Count Correct? yes   PACU: portable radiograph - low AP   Admission: inpatient status, start PT & OT POD#1  Plan/RTC: Return in 2 weeks for staple removal. Return in 6 weeks to see MD.  Weight Bearing/Load Lower Extremity: full  Hip precautions: none  N. Eduard Roux, MD Marga Hoots 3:50 PM   Implant Name Type Inv.  Item Serial No. Manufacturer Lot No. LRB No. Used Action  HIP BALL ARTICU DEPUY - [id number] Hips HIP BALL ARTICU DEPUY  DEPUY SYNTHES [id number] Left 1 Implanted  BIPOLAR DEPUY  - [id number] Hips BIPOLAR DEPUY  DEPUY SYNTHES [id number] Left 1 Implanted  STEM FEM ACTIS STD SZ4 - [id number] Stem STEM FEM ACTIS STD SZ4  DEPUY SYNTHES [id number] Left 1 Implanted

## 2019-10-12 NOTE — H&P (Signed)
H&P update  The surgical history has been reviewed and remains accurate without interval change.  The patient was re-examined and patient's physiologic condition has not changed significantly in the last 30 days. The condition still exists that makes this procedure necessary. The treatment plan remains the same, without new options for care.  No new pharmacological allergies or types of therapy has been initiated that would change the plan or the appropriateness of the plan.  The patient and/or family understand the potential benefits and risks.  Mayra Reel, MD 10/12/2019 8:05 AM

## 2019-10-12 NOTE — Plan of Care (Signed)

## 2019-10-12 NOTE — Progress Notes (Signed)
Patient ID: Joan Nash, female   DOB: 06-19-1946, 74 y.o.   MRN: [id number]  PROGRESS NOTE    Joan Nash  YKD:[id number] DOB: 1946-03-05 DOA: 10/11/2019 PCP: Arnette Felts, FNP   Brief Narrative:  74 year old female with history of hypertension, schizophrenia, depression, dementia who is a group home resident presented with fall and was found to have left hip fracture.  Orthopedics was consulted.  COVID-19 tested positive.  Assessment & Plan:   Closed left femoral neck fracture secondary to mechanical fall -Fall precautions.  Pain management.  NPO.  Possible surgical intervention by orthopedics today -Subsequent PT/OT evaluation after surgery and might need rehab placement  Asymptomatic COVID-19 infection -Tested positive for COVID-19.  No evidence of hypoxia.  Chest x-ray with no signs of infiltrate.  Will monitor intermittent markers.  We will hold off on steroids/remdesivir  Schizophrenia -Stable.  Continue outpatient regimen  Dementia -Monitor mental status.  Fall precautions  Hypertension -Blood pressure stable.  Continue lisinopril and hydrochlorothiazide  Thrombocytopenia -Questionable cause.  Monitor    DVT prophylaxis: Heparin Code Status: Full Family Communication: None at bedside Disposition Plan: Depends on surgical outcome and subsequent PT evaluation  Consultants: Orthopedic surgery  Procedures: None  Antimicrobials: None   Subjective: Patient seen and examined at bedside.  She is awake but confused.  No overnight fever or vomiting reported by nursing staff.  Objective: Vitals:   10/11/19 1700 10/11/19 1800 10/11/19 1900 10/11/19 2005  BP: 139/80 106/80 133/78 133/66  Pulse: 63 (!) 55 (!) 58 61  Resp: (!) 25 (!) 21 (!) 21 19  Temp:    98.2 F (36.8 C)  TempSrc:    Oral  SpO2: 100% 100% 98% 100%   No intake or output data in the 24 hours ending 10/12/19 1029 There were no vitals filed for this visit.  Examination:  General exam: Appears  calm and comfortable.  Awake but pleasantly confused. Respiratory system: Bilateral decreased breath sounds at bases.  Intermittently tachypneic Cardiovascular system: S1 & S2 heard, bradycardic Gastrointestinal system: Abdomen is nondistended, soft and nontender. Normal bowel sounds heard. Extremities: No cyanosis, clubbing, edema   Data Reviewed: I have personally reviewed following labs and imaging studies  CBC: Recent Labs  Lab 10/11/19 1430 10/12/19 0324  WBC 5.1 4.6  NEUTROABS 3.5  --   HGB 13.5 12.5  HCT 42.5 38.9  MCV 93.6 91.7  PLT 152 146*   Basic Metabolic Panel: Recent Labs  Lab 10/11/19 1430 10/12/19 0324  NA 146* 146*  K 4.0 3.4*  CL 109 108  CO2 27 29  GLUCOSE 100* 102*  BUN 20 21  CREATININE 0.66 0.60  CALCIUM 9.7 9.4  MG  --  1.9  PHOS  --  4.0   GFR: CrCl cannot be calculated (Unknown ideal weight.). Liver Function Tests: Recent Labs  Lab 10/11/19 1430 10/12/19 0324  AST 38 27  ALT 27 26  ALKPHOS 38 35*  BILITOT 0.7 0.7  PROT 7.5 6.7  ALBUMIN 3.1* 2.6*   No results for input(s): LIPASE, AMYLASE in the last 168 hours. No results for input(s): AMMONIA in the last 168 hours. Coagulation Profile: Recent Labs  Lab 10/11/19 1430  INR 1.1   Cardiac Enzymes: No results for input(s): CKTOTAL, CKMB, CKMBINDEX, TROPONINI in the last 168 hours. BNP (last 3 results) No results for input(s): PROBNP in the last 8760 hours. HbA1C: No results for input(s): HGBA1C in the last 72 hours. CBG: No results for input(s): GLUCAP in the last 168  hours. Lipid Profile: No results for input(s): CHOL, HDL, LDLCALC, TRIG, CHOLHDL, LDLDIRECT in the last 72 hours. Thyroid Function Tests: No results for input(s): TSH, T4TOTAL, FREET4, T3FREE, THYROIDAB in the last 72 hours. Anemia Panel: Recent Labs    10/11/19 1731 10/12/19 0324  FERRITIN 150 226   Sepsis Labs: No results for input(s): PROCALCITON, LATICACIDVEN in the last 168 hours.  Recent Results  (from the past 240 hour(s))  Respiratory Panel by RT PCR (Flu A&B, Covid) - Nasopharyngeal Swab     Status: Abnormal   Collection Time: 10/11/19  3:37 PM   Specimen: Nasopharyngeal Swab  Result Value Ref Range Status   SARS Coronavirus 2 by RT PCR POSITIVE (A) NEGATIVE Final    Comment: RESULT CALLED TO, READ BACK BY AND VERIFIED WITH: Lyda Kalata RN 16:55 10/11/19 ([email or web address]) (NOTE) SARS-CoV-2 target nucleic acids are DETECTED. SARS-CoV-2 RNA is generally detectable in upper respiratory specimens  during the acute phase of infection. Positive results are indicative of the presence of the identified virus, but do not rule out bacterial infection or co-infection with other pathogens not detected by the test. Clinical correlation with patient history and other diagnostic information is necessary to determine patient infection status. The expected result is Negative. Fact Sheet for Patients:  PinkCheek.be Fact Sheet for Healthcare Providers: GravelBags.it This test is not yet approved or cleared by the [place] FDA and  has been authorized for detection and/or diagnosis of SARS-CoV-2 by FDA under an Emergency Use Authorization (EUA).  This EUA will remain in effect (meaning this test can be used)  for the duration of  the COVID-19 declaration under Section 564(b)(1) of the Act, 21 U.S.C. section 360bbb-3(b)(1), unless the authorization is terminated or revoked sooner.    Influenza A by PCR NEGATIVE NEGATIVE Final   Influenza B by PCR NEGATIVE NEGATIVE Final    Comment: (NOTE) The Xpert Xpress SARS-CoV-2/FLU/RSV assay is intended as an aid in  the diagnosis of influenza from Nasopharyngeal swab specimens and  should not be used as a sole basis for treatment. Nasal washings and  aspirates are unacceptable for Xpert Xpress SARS-CoV-2/FLU/RSV  testing. Fact Sheet for Patients: PinkCheek.be Fact Sheet  for Healthcare Providers: GravelBags.it This test is not yet approved or cleared by the [place] FDA and  has been authorized for detection and/or diagnosis of SARS-CoV-2 by  FDA under an Emergency Use Authorization (EUA). This EUA will remain  in effect (meaning this test can be used) for the duration of the  Covid-19 declaration under Section 564(b)(1) of the Act, 21  U.S.C. section 360bbb-3(b)(1), unless the authorization is  terminated or revoked. Performed at [facility], [place] [address], [place], [place] [address]   Surgical PCR screen     Status: None   Collection Time: 10/11/19  9:35 PM   Specimen: Nasal Mucosa; Nasal Swab  Result Value Ref Range Status   MRSA, PCR NEGATIVE NEGATIVE Final   Staphylococcus aureus NEGATIVE NEGATIVE Final    Comment: (NOTE) The Xpert SA Assay (FDA approved for NASAL specimens in patients 49 years of age and older), is one component of a comprehensive surveillance program. It is not intended to diagnose infection nor to guide or monitor treatment. Performed at [facility], [place] [address] [place], [place] [address]          Radiology Studies: DG Chest 1 View  Result Date: 10/11/2019 CLINICAL DATA:  Fall.  Left leg pain. EXAM: CHEST  1 VIEW COMPARISON:  04/10/2016 FINDINGS: The heart size and mediastinal contours are within normal limits. Both lungs are clear. The visualized skeletal structures are unremarkable. IMPRESSION: No active disease. Electronically Signed   By: Paulina Fusi M.D.   On: 10/11/2019 14:21   CT Head Wo Contrast  Result Date: 10/11/2019 CLINICAL DATA:  Trauma, fall from bed EXAM: CT HEAD WITHOUT CONTRAST CT CERVICAL SPINE WITHOUT CONTRAST TECHNIQUE: Multidetector CT imaging of the head and cervical spine was performed following the standard protocol without intravenous contrast. Multiplanar CT image reconstructions of the cervical spine were also generated. COMPARISON:  None.  FINDINGS: CT HEAD FINDINGS Brain: No evidence of acute infarction, hemorrhage, hydrocephalus, extra-axial collection or mass lesion/mass effect. Periventricular white matter hypodensity. Vascular: No hyperdense vessel or unexpected calcification. Skull: Normal. Negative for fracture or focal lesion. Sinuses/Orbits: No acute finding. Other: None. CT CERVICAL SPINE FINDINGS Alignment: Normal. Skull base and vertebrae: No acute fracture. No primary bone lesion or focal pathologic process. 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 Soft tissues and spinal canal: No prevertebral fluid or swelling. No visible canal hematoma. Disc levels: Moderate disc space height loss and osteophytosis of the lower cervical spine. Upper chest: Negative. Other: None. IMPRESSION: 1. No acute intracranial pathology. Small-vessel white matter disease. 2. No fracture or static subluxation of the cervical spine. Electronically Signed   By: Lauralyn Primes M.D.   On: 10/11/2019 15:54   CT Cervical Spine Wo Contrast  Result Date: 10/11/2019 CLINICAL DATA:  Trauma, fall from bed EXAM: CT HEAD WITHOUT CONTRAST CT CERVICAL SPINE WITHOUT CONTRAST TECHNIQUE: Multidetector CT imaging of the head and cervical spine was performed following the standard protocol without intravenous contrast. Multiplanar CT image reconstructions of the cervical spine were also generated. COMPARISON:  None. FINDINGS: CT HEAD FINDINGS Brain: No evidence of acute infarction, hemorrhage, hydrocephalus, extra-axial collection or mass lesion/mass effect. Periventricular white matter hypodensity. Vascular: No hyperdense vessel or unexpected calcification. Skull: Normal. Negative for fracture or focal lesion. Sinuses/Orbits: No acute finding. Other: None. CT CERVICAL SPINE FINDINGS Alignment: Normal. Skull base and vertebrae: No acute fracture. No primary bone lesion or focal pathologic process. 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  Soft tissues and spinal canal: No prevertebral fluid or swelling. No visible canal hematoma. Disc levels: Moderate disc space height loss and osteophytosis of the lower cervical spine. Upper chest: Negative. Other: None. IMPRESSION: 1. No acute intracranial pathology. Small-vessel white matter disease. 2. No fracture or static subluxation of the cervical spine. Electronically Signed   By: Lauralyn Primes M.D.   On: 10/11/2019 15:54   DG Hip Unilat W or Wo Pelvis 2-3 Views Left  Result Date: 10/11/2019 CLINICAL DATA:  Left hip pain after fall. EXAM: DG HIP (WITH OR WITHOUT PELVIS) 2-3V LEFT COMPARISON:  None. FINDINGS: Acute displaced fracture of the left femoral neck with varus and apex anterior angulation. No additional fracture. No dislocation. The pubic symphysis and sacroiliac joints are intact. Soft tissues are unremarkable. IMPRESSION: 1. Acute displaced and angulated left femoral neck fracture. Electronically Signed   By: Obie Dredge M.D.   On: 10/11/2019 14:21        Scheduled Meds: . albuterol  2 puff Inhalation Q6H  . vitamin C  500 mg Oral Daily  . benztropine  1 mg Oral Daily  . divalproex  250 mg Oral q morning - 10a   And  . divalproex  500 mg Oral QHS  . FLUoxetine  20 mg Oral Daily  .  hydrochlorothiazide  12.5 mg Oral Daily  . lisinopril  10 mg Oral Daily  . mupirocin ointment  1 application Nasal BID  . povidone-iodine  2 application Topical Once  . povidone-iodine  2 application Topical Once  . pravastatin  20 mg Oral Daily  . tranexamic acid (CYKLOKAPRON) topical -INTRAOP  2,000 mg Topical To OR  . zinc sulfate  220 mg Oral Daily   Continuous Infusions: . clindamycin (CLEOCIN) IV    . tranexamic acid    . vancomycin            Glade Lloyd, MD [facility] 10/12/2019, 10:29 AM

## 2019-10-12 NOTE — Anesthesia Procedure Notes (Signed)
Procedure Name: MAC Date/Time: 10/12/2019 2:08 PM Performed by: Trinna Post., CRNA Pre-anesthesia Checklist: Patient identified, Emergency Drugs available, Suction available, Patient being monitored and Timeout performed Patient Re-evaluated:Patient Re-evaluated prior to induction Oxygen Delivery Method: Simple face mask Preoxygenation: Pre-oxygenation with 100% oxygen Induction Type: IV induction Placement Confirmation: positive ETCO2

## 2019-10-12 NOTE — Anesthesia Procedure Notes (Signed)
Spinal  Patient location during procedure: OR Staffing Performed: anesthesiologist  Anesthesiologist: Lewie Loron, MD Preanesthetic Checklist Completed: patient identified, IV checked, site marked, risks and benefits discussed, surgical consent, monitors and equipment checked, pre-op evaluation and timeout performed Spinal Block Patient position: left lateral decubitus Prep: ChloraPrep and site prepped and draped Patient monitoring: heart rate, cardiac monitor, continuous pulse ox and blood pressure Approach: right paramedian Location: L3-4 Injection technique: single-shot Needle Needle type: Sprotte  Needle gauge: 24 G Needle length: 9 cm Assessment Sensory level: T4

## 2019-10-12 NOTE — Telephone Encounter (Signed)
Patient's niece/guardian Glinda Cromartie retuning call from Dr. Roda Shutters. Patient's guardian would like a call back from Dr. Roda Shutters. Phone number is [phone].

## 2019-10-13 ENCOUNTER — Encounter: Payer: Self-pay | Admitting: *Deleted

## 2019-10-13 ENCOUNTER — Inpatient Hospital Stay (HOSPITAL_COMMUNITY): Payer: Medicare Other

## 2019-10-13 DIAGNOSIS — U071 COVID-19: Secondary | ICD-10-CM

## 2019-10-13 LAB — COMPREHENSIVE METABOLIC PANEL
ALT: 23 U/L (ref 0–44)
AST: 40 U/L (ref 15–41)
Albumin: 2.5 g/dL — ABNORMAL LOW (ref 3.5–5.0)
Alkaline Phosphatase: 40 U/L (ref 38–126)
Anion gap: 13 (ref 5–15)
BUN: 22 mg/dL (ref 8–23)
CO2: 25 mmol/L (ref 22–32)
Calcium: 9 mg/dL (ref 8.9–10.3)
Chloride: 102 mmol/L (ref 98–111)
Creatinine, Ser: 0.64 mg/dL (ref 0.44–1.00)
GFR calc Af Amer: >60 mL/min (ref >60)
GFR calc non Af Amer: >60 mL/min (ref >60)
Glucose, Bld: 108 mg/dL — ABNORMAL HIGH (ref 70–99)
Potassium: 5.1 mmol/L (ref 3.5–5.1)
Sodium: 140 mmol/L (ref 135–145)
Total Bilirubin: 1.8 mg/dL — ABNORMAL HIGH (ref 0.3–1.2)
Total Protein: 6.5 g/dL (ref 6.5–8.1)

## 2019-10-13 LAB — MAGNESIUM: Magnesium: 2.1 mg/dL (ref 1.7–2.4)

## 2019-10-13 LAB — CBC
HCT: 45 % (ref 36.0–46.0)
Hemoglobin: 14.2 g/dL (ref 12.0–15.0)
MCH: 29.3 pg (ref 26.0–34.0)
MCHC: 31.6 g/dL (ref 30.0–36.0)
MCV: 92.8 fL (ref 80.0–100.0)
Platelets: 205 10*3/uL (ref 150–400)
RBC: 4.85 MIL/uL (ref 3.87–5.11)
RDW: 14.8 % (ref 11.5–15.5)
WBC: 8.7 10*3/uL (ref 4.0–10.5)
nRBC: 0 % (ref 0.0–0.2)

## 2019-10-13 LAB — FERRITIN: Ferritin: 405 ng/mL — ABNORMAL HIGH (ref 11–307)

## 2019-10-13 LAB — D-DIMER, QUANTITATIVE: D-Dimer, Quant: 7.32 ug/mL-FEU — ABNORMAL HIGH (ref 0.00–0.50)

## 2019-10-13 LAB — C-REACTIVE PROTEIN: CRP: 17.8 mg/dL — ABNORMAL HIGH (ref <1.0)

## 2019-10-13 NOTE — Progress Notes (Signed)
Subjective: 1 Day Post-Op Procedure(s) (LRB): ANTERIOR APPROACH HEMI HIP ARTHROPLASTY (Left) Patient reports pain as patient alert but not communicating.  Does not appear to be in pain  Objective: Vital signs in last 24 hours: Temp:  [97.9 F (36.6 C)-98.4 F (36.9 C)] 98.2 F (36.8 C) (01/29 0352) Pulse Rate:  [69-93] 93 (01/29 0352) Resp:  [17-22] 17 (01/28 1724) BP: (120-140)/(51-80) 135/80 (01/29 0352) SpO2:  [95 %-100 %] 100 % (01/28 2225)  Intake/Output from previous day: 01/28 0701 - 01/29 0700 In: 1756.3 [P.O.:720; I.V.:843.3; IV Piggyback:93] Out: 450 [Urine:300; Blood:150] Intake/Output this shift: No intake/output data recorded.  Recent Labs    10/11/19 1430 10/12/19 0324 10/12/19 1909  HGB 13.5 12.5 13.7   Recent Labs    10/12/19 0324 10/12/19 1909  WBC 4.6 8.0  RBC 4.24 4.71  HCT 38.9 43.6  PLT 146* 163   Recent Labs    10/12/19 0324 10/12/19 0324 10/12/19 1909 10/13/19 0417  NA 146*  --   --  140  K 3.4*  --   --  5.1  CL 108  --   --  102  CO2 29  --   --  25  BUN 21  --   --  22  CREATININE 0.60   < > 0.64 0.64  GLUCOSE 102*  --   --  108*  CALCIUM 9.4  --   --  9.0   < > = values in this interval not displayed.   Recent Labs    10/11/19 1430  INR 1.1    Neurovascular intact Intact pulses distally Incision: dressing C/D/I No cellulitis present Compartment soft   Assessment/Plan: 1 Day Post-Op Procedure(s) (LRB): ANTERIOR APPROACH HEMI HIP ARTHROPLASTY (Left) Up with therapy  WBAT LLE Lovenox 40 daily x 2 weeks for dvt ppx F/u with Dr. Roda Shutters 2 weeks post-op for suture removal Dry dressing change prn      Cristie Hem 10/13/2019, 8:15 AM

## 2019-10-13 NOTE — Evaluation (Signed)
Physical Therapy Evaluation Patient Details Name: Joan Nash MRN: [id number] DOB: 1945-12-31 Today's Date: 10/13/2019   History of Present Illness  74 year old female with history of hypertension, schizophrenia, depression, dementia who is a group home resident presented with fall and was found to have left hip fracture.  Orthopedics was consulted.  COVID-19 tested positive.  Underwent direct anterior approach left hip hemiarthoplasty.   Clinical Impression  Pt admitted with above diagnosis. Pt was able to pivot with +2 max assist to 3n1 and then to recliner.  Heavy posterior lean at times and could not move the left LE without assist.  Will need SNF.  Pt currently with functional limitations due to the deficits listed below (see PT Problem List). Pt will benefit from skilled PT to increase their independence and safety with mobility to allow discharge to the venue listed below.      Follow Up Recommendations SNF;Supervision/Assistance - 24 hour    Equipment Recommendations  Other (comment)(TBA)    Recommendations for Other Services       Precautions / Restrictions Precautions Precautions: Fall Restrictions Weight Bearing Restrictions: Yes LLE Weight Bearing: Weight bearing as tolerated      Mobility  Bed Mobility Overal bed mobility: Needs Assistance Bed Mobility: Supine to Sit     Supine to sit: Mod assist;+2 for physical assistance;HOB elevated     General bed mobility comments: Pt needed assist to move LEs and to reach for rail. Also needed asssist to elevate trunk.    Transfers Overall transfer level: Needs assistance Equipment used: Rolling walker (2 wheeled) Transfers: Sit to/from [facility] Sit to Stand: Mod assist;Max assist;+2 physical assistance;From elevated surface Stand pivot transfers: Max assist;+2 physical assistance       General transfer comment: Pt needed incr assist to stand and cues for hand placement as pt kept trying to pull  up on RW.  Pt needed assist for power up as well as pt leaning posterior in standing needing max assist to maintain balance.  Pt with BM noted therefore assistedpt to 3N1.  Pt needed cues and assist to move her left LE as she had diffuclty moving it intiially and was pivoting left due to room setup.  Once finished stood with max assist to rW to be cleaned and pivoted to left again to recliner with same assist and cues.  Had to move RW for pt.   Ambulation/Gait                Stairs            Wheelchair Mobility    Modified Rankin (Stroke Patients Only)       Balance Overall balance assessment: Needs assistance;History of Falls Sitting-balance support: Bilateral upper extremity supported;Feet supported Sitting balance-Leahy Scale: Poor Sitting balance - Comments: relies on UE supporr as she had a posterior lean Postural control: Posterior lean Standing balance support: Bilateral upper extremity supported;During functional activity Standing balance-Leahy Scale: Poor Standing balance comment: Posterior lean in standing with max assist of 2 to steady with bil UE support on RW                             Pertinent Vitals/Pain Pain Assessment: Faces Faces Pain Scale: Hurts little more Pain Location: left LE Pain Descriptors / Indicators: Aching;Grimacing;Guarding Pain Intervention(s): Limited activity within patient's tolerance;Monitored during session;Repositioned    Home Living Family/patient expects to be discharged to:: Group home  Additional Comments: Unable to assess home situation as pt could not answer questions about this    Prior Function           Comments: Unsure of prior functional level     Hand Dominance        Extremity/Trunk Assessment   Upper Extremity Assessment Upper Extremity Assessment: Defer to OT evaluation    Lower Extremity Assessment Lower Extremity Assessment: RLE deficits/detail;LLE  deficits/detail RLE Deficits / Details: grossly 3-/5 LLE Deficits / Details: grossly 2+/5    Cervical / Trunk Assessment Cervical / Trunk Assessment: Kyphotic  Communication   Communication: Expressive difficulties  Cognition Arousal/Alertness: Awake/alert Behavior During Therapy: Anxious Overall Cognitive Status: History of cognitive impairments - at baseline                                        General Comments      Exercises     Assessment/Plan    PT Assessment Patient needs continued PT services  PT Problem List Decreased activity tolerance;Decreased balance;Decreased mobility;Decreased knowledge of use of DME;Decreased safety awareness;Decreased knowledge of precautions;Cardiopulmonary status limiting activity;Pain       PT Treatment Interventions DME instruction;Gait training;Functional mobility training;Therapeutic activities;Therapeutic exercise;Balance training;Patient/family education    PT Goals (Current goals can be found in the Care Plan section)  Acute Rehab PT Goals Patient Stated Goal: unable to state PT Goal Formulation: Patient unable to participate in goal setting Time For Goal Achievement: 10/27/19 Potential to Achieve Goals: Fair    Frequency Min 2X/week   Barriers to discharge Decreased caregiver support      Co-evaluation PT/OT/SLP Co-Evaluation/Treatment: Yes Reason for Co-Treatment: Complexity of the patient's impairments (multi-system involvement);For patient/therapist safety PT goals addressed during session: Mobility/safety with mobility         AM-PAC PT "6 Clicks" Mobility  Outcome Measure Help needed turning from your back to your side while in a flat bed without using bedrails?: A Lot Help needed moving from lying on your back to sitting on the side of a flat bed without using bedrails?: A Lot Help needed moving to and from a bed to a chair (including a wheelchair)?: Total Help needed standing up from a chair  using your arms (e.g., wheelchair or bedside chair)?: Total Help needed to walk in hospital room?: Total Help needed climbing 3-5 steps with a railing? : Total 6 Click Score: 8    End of Session Equipment Utilized During Treatment: Gait belt Activity Tolerance: Patient limited by fatigue;Patient limited by pain Patient left: in chair;with call bell/phone within reach;with chair alarm set Nurse Communication: Mobility status;Need for lift equipment PT Visit Diagnosis: Unsteadiness on feet (R26.81);Muscle weakness (generalized) (M62.81);Pain Pain - Right/Left: Left Pain - part of body: Hip    Time: 4665-9935 PT Time Calculation (min) (ACUTE ONLY): 24 min   Charges:   PT Evaluation $PT Eval Moderate Complexity: 1 Mod          Stepheny Canal W,PT Acute Rehabilitation Services Pager:  [phone]  Office:  [phone]    Berline Lopes 10/13/2019, 2:08 PM

## 2019-10-13 NOTE — Evaluation (Signed)
Occupational Therapy Evaluation Patient Details Name: Joan Nash MRN: [id number] DOB: Apr 25, 1946 Today's Date: 10/13/2019    History of Present Illness 74 year old female with history of hypertension, schizophrenia, depression, dementia who is a group home resident presented with fall and was found to have left hip fracture.  Orthopedics was consulted.  COVID-19 tested positive.  Underwent direct anterior approach left hip hemiarthoplasty.    Clinical Impression   Patient admitted for above and limited by problem list below, including impaired cognition, impaired balance, L LE pain, decreased activity tolerance and generalized weakness. Patient unable to provide PLOF, but per chart review is from a group home and anticipate needing some assist due to baseline cognitive deficits.  She is oriented to self today, follows minimal commands with increased time and demonstrates slow processing, decreased awareness; minimal verbalizations during session.  Patient requires min to total assist for self care, mod-max assist +2 for toilet transfers.  She will benefit from continued OT services while admitted and after dc at SNF Level in order to optimize return to PLOF with ADLs, mobility.     Follow Up Recommendations  SNF;Supervision/Assistance - 24 hour    Equipment Recommendations  3 in 1 bedside commode    Recommendations for Other Services       Precautions / Restrictions Precautions Precautions: Fall Restrictions Weight Bearing Restrictions: Yes LLE Weight Bearing: Weight bearing as tolerated      Mobility Bed Mobility Overal bed mobility: Needs Assistance Bed Mobility: Supine to Sit     Supine to sit: Mod assist;+2 for physical assistance;HOB elevated     General bed mobility comments: Pt needed assist to move LEs and to reach for rail. Also needed asssist to elevate trunk.  Poor initation and sequencing.  Transfers Overall transfer level: Needs assistance Equipment used:  Rolling walker (2 wheeled) Transfers: Sit to/from [facility] Sit to Stand: Mod assist;Max assist;+2 physical assistance;From elevated surface Stand pivot transfers: Max assist;+2 physical assistance       General transfer comment: sit to stand from EOB over multiple trials ranging from mod-max assist +2 with cueing for hand placement, technqiue and safety; pivot to 3:1 with max assist +2 due to poor initation and movement of L LE (towards L side, due to room setup), balance and weakness; after using 3:1 commode, pulled recliner partially behind patient to transition to recliner     Balance Overall balance assessment: Needs assistance;History of Falls Sitting-balance support: Bilateral upper extremity supported;Feet supported Sitting balance-Leahy Scale: Poor Sitting balance - Comments: relies on UE support as she had a posterior lean, improved with prolonged sitting EOB to min assist  Postural control: Posterior lean Standing balance support: Bilateral upper extremity supported;During functional activity Standing balance-Leahy Scale: Poor Standing balance comment: posterior lean and reliant on BUE and external support                           ADL either performed or assessed with clinical judgement   ADL Overall ADL's : Needs assistance/impaired     Grooming: Minimal assistance;Sitting   Upper Body Bathing: Minimal assistance;Sitting   Lower Body Bathing: +2 for physical assistance;+2 for safety/equipment;Maximal assistance;Sit to/from stand   Upper Body Dressing : Minimal assistance;Sitting   Lower Body Dressing: Total assistance;+2 for physical assistance;+2 for safety/equipment;Sit to/from stand   Toilet Transfer: Maximal assistance;+2 for physical assistance;+2 for safety/equipment;Stand-pivot;BSC;RW   Toileting- Clothing Manipulation and Hygiene: Total assistance;+2 for physical assistance;+2 for safety/equipment;Sit to/from stand Toileting -  Clothing Manipulation Details (indicate cue type and reason): incontient BM EOB, total assist for hygiene      Functional mobility during ADLs: Moderate assistance;Maximal assistance;+2 for physical assistance;+2 for safety/equipment;Rolling walker General ADL Comments: pt limited by cognition, impaired balance, weakness, pain      Vision   Vision Assessment?: No apparent visual deficits     Perception     Praxis      Pertinent Vitals/Pain Pain Assessment: Faces Faces Pain Scale: Hurts little more Pain Location: left LE Pain Descriptors / Indicators: Aching;Grimacing;Guarding Pain Intervention(s): Limited activity within patient's tolerance;Monitored during session;Repositioned     Hand Dominance     Extremity/Trunk Assessment Upper Extremity Assessment Upper Extremity Assessment: Generalized weakness   Lower Extremity Assessment Lower Extremity Assessment: Defer to PT evaluation(L surgergy) RLE Deficits / Details: grossly 3-/5 LLE Deficits / Details: grossly 2+/5   Cervical / Trunk Assessment Cervical / Trunk Assessment: Kyphotic   Communication Communication Communication: Expressive difficulties(muffled speech )   Cognition Arousal/Alertness: Awake/alert Behavior During Therapy: Anxious Overall Cognitive Status: History of cognitive impairments - at baseline                                 General Comments: pt oriented to self, but limited verbalizations and difficulty to understand at times; noted slow processing and difficulty sequecning    General Comments       Exercises     Shoulder Instructions      Home Living Family/patient expects to be discharged to:: Group home                                 Additional Comments: Unable to assess home situation as pt could not answer questions about this      Prior Functioning/Environment          Comments: Unsure of prior functional level        OT Problem List:  Decreased strength;Decreased range of motion;Decreased activity tolerance;Impaired balance (sitting and/or standing);Decreased coordination;Decreased cognition;Decreased safety awareness;Decreased knowledge of use of DME or AE;Decreased knowledge of precautions;Pain      OT Treatment/Interventions: Self-care/ADL training;DME and/or AE instruction;Therapeutic exercise;Therapeutic activities;Patient/family education;Balance training;Cognitive remediation/compensation    OT Goals(Current goals can be found in the care plan section) Acute Rehab OT Goals Patient Stated Goal: unable to state OT Goal Formulation: Patient unable to participate in goal setting Time For Goal Achievement: 10/27/19 Potential to Achieve Goals: Good  OT Frequency: Min 2X/week   Barriers to D/C:            Co-evaluation PT/OT/SLP Co-Evaluation/Treatment: Yes Reason for Co-Treatment: Complexity of the patient's impairments (multi-system involvement) PT goals addressed during session: Mobility/safety with mobility OT goals addressed during session: ADL's and self-care      AM-PAC OT "6 Clicks" Daily Activity     Outcome Measure Help from another person eating meals?: A Little Help from another person taking care of personal grooming?: A Little Help from another person toileting, which includes using toliet, bedpan, or urinal?: Total Help from another person bathing (including washing, rinsing, drying)?: A Lot Help from another person to put on and taking off regular upper body clothing?: A Little Help from another person to put on and taking off regular lower body clothing?: Total 6 Click Score: 13   End of Session Equipment Utilized During Treatment: Gait belt;Rolling walker Nurse Communication: Mobility status  Activity Tolerance: Patient tolerated treatment well Patient left: in chair;with call bell/phone within reach;with chair alarm set  OT Visit Diagnosis: Other abnormalities of gait and mobility  (R26.89);Muscle weakness (generalized) (M62.81);History of falling (Z91.81);Pain;Other symptoms and signs involving cognitive function Pain - Right/Left: Left Pain - part of body: Hip;Leg                Time: 5927-6394 OT Time Calculation (min): 24 min Charges:  OT General Charges $OT Visit: 1 Visit OT Evaluation $OT Eval Moderate Complexity: 1 Mod  Barry Brunner, OT Acute Rehabilitation Services Pager [phone] Office [phone]   Chancy Milroy 10/13/2019, 3:23 PM

## 2019-10-13 NOTE — Progress Notes (Signed)
Patient ID: Joan Nash, female   DOB: 09-27-1945, 74 y.o.   MRN: [id number]  PROGRESS NOTE    Jahna Liebert  EVO:[id number] DOB: 09-Oct-1945 DOA: 10/11/2019 PCP: Arnette Felts, FNP   Brief Narrative:  74 year old female with history of hypertension, schizophrenia, depression, dementia who is a group home resident presented with fall and was found to have left hip fracture.  Orthopedics was consulted.  COVID-19 tested positive.  Assessment & Plan:   Closed left femoral neck fracture secondary to mechanical fall -Fall precautions.  Pain management.   -Status post surgical intervention on 10/12/2019 by orthopedics.  Wound care as per orthopedics recommendations. -PT/OT eval.  Might need SNF placement.  Asymptomatic COVID-19 infection -Tested positive for COVID-19.  No evidence of hypoxia.  Chest x-ray with no signs of infiltrate.   -Will monitor inflammatory markers.  COVID-19 Labs  Recent Labs    10/11/19 1731 10/12/19 0324 10/13/19 0417  DDIMER  --  7.37* 7.32*  FERRITIN 150 226 405*  LDH 573*  --   --   CRP 7.4* 14.0* 17.8*    Lab Results  Component Value Date   SARSCOV2NAA POSITIVE (A) 10/11/2019   CRP and D-dimer uptrending.  Will check chest x-ray again today.  Currently not on steroids or remdesivir.  Schizophrenia -Stable.  Continue outpatient regimen  Dementia -Monitor mental status.  Fall precautions  Hypertension -Blood pressure stable.  Continue lisinopril and hydrochlorothiazide  Thrombocytopenia -Questionable cause.  Monitor    DVT prophylaxis: Heparin Code Status: Full Family Communication: None at bedside Disposition Plan: Might need SNF placement pending PT evaluation  Consultants: Orthopedic surgery  Procedures: Left hip hemiarthroplasty on 10/12/2019  Antimicrobials: Perioperative  Subjective: Patient seen and examined at bedside.  Poor historian.  She is awake but confused.  No fever or vomiting reported by nursing  staff.  Objective: Vitals:   10/12/19 1700 10/12/19 1724 10/12/19 2225 10/13/19 0352  BP: (!) 140/51 138/67 123/75 135/80  Pulse:   91 93  Resp:  17    Temp: 98.2 F (36.8 C) 98.4 F (36.9 C) 97.9 F (36.6 C) 98.2 F (36.8 C)  TempSrc:  Axillary Oral Oral  SpO2:  100% 100%     Intake/Output Summary (Last 24 hours) at 10/13/2019 0758 Last data filed at 10/13/2019 0518 Gross per 24 hour  Intake 1756.32 ml  Output 450 ml  Net 1306.32 ml   There were no vitals filed for this visit.  Examination:  General exam: No distress.  Awake but pleasantly confused. Respiratory system: Bilateral decreased breath sounds at bases with some scattered crackles.   Cardiovascular system: Currently rate controlled, S1-S2 heard Gastrointestinal system: Abdomen is nondistended, soft and nontender. Normal bowel sounds heard. Extremities: No cyanosis, clubbing, edema   Data Reviewed: I have personally reviewed following labs and imaging studies  CBC: Recent Labs  Lab 10/11/19 1430 10/12/19 0324 10/12/19 1909  WBC 5.1 4.6 8.0  NEUTROABS 3.5  --   --   HGB 13.5 12.5 13.7  HCT 42.5 38.9 43.6  MCV 93.6 91.7 92.6  PLT 152 146* 163   Basic Metabolic Panel: Recent Labs  Lab 10/11/19 1430 10/12/19 0324 10/12/19 1909 10/13/19 0417  NA 146* 146*  --  140  K 4.0 3.4*  --  5.1  CL 109 108  --  102  CO2 27 29  --  25  GLUCOSE 100* 102*  --  108*  BUN 20 21  --  22  CREATININE 0.66 0.60 0.64 0.64  CALCIUM 9.7 9.4  --  9.0  MG  --  1.9  --  2.1  PHOS  --  4.0  --   --    GFR: CrCl cannot be calculated (Unknown ideal weight.). Liver Function Tests: Recent Labs  Lab 10/11/19 1430 10/12/19 0324 10/13/19 0417  AST 38 27 40  ALT 27 26 23   ALKPHOS 38 35* 40  BILITOT 0.7 0.7 1.8*  PROT 7.5 6.7 6.5  ALBUMIN 3.1* 2.6* 2.5*   No results for input(s): LIPASE, AMYLASE in the last 168 hours. No results for input(s): AMMONIA in the last 168 hours. Coagulation Profile: Recent Labs  Lab  10/11/19 1430  INR 1.1   Cardiac Enzymes: No results for input(s): CKTOTAL, CKMB, CKMBINDEX, TROPONINI in the last 168 hours. BNP (last 3 results) No results for input(s): PROBNP in the last 8760 hours. HbA1C: No results for input(s): HGBA1C in the last 72 hours. CBG: Recent Labs  Lab 10/12/19 1715  GLUCAP 117*   Lipid Profile: No results for input(s): CHOL, HDL, LDLCALC, TRIG, CHOLHDL, LDLDIRECT in the last 72 hours. Thyroid Function Tests: No results for input(s): TSH, T4TOTAL, FREET4, T3FREE, THYROIDAB in the last 72 hours. Anemia Panel: Recent Labs    10/12/19 0324 10/13/19 0417  FERRITIN 226 405*   Sepsis Labs: No results for input(s): PROCALCITON, LATICACIDVEN in the last 168 hours.  Recent Results (from the past 240 hour(s))  Respiratory Panel by RT PCR (Flu A&B, Covid) - Nasopharyngeal Swab     Status: Abnormal   Collection Time: 10/11/19  3:37 PM   Specimen: Nasopharyngeal Swab  Result Value Ref Range Status   SARS Coronavirus 2 by RT PCR POSITIVE (A) NEGATIVE Final    Comment: RESULT CALLED TO, READ BACK BY AND VERIFIED WITH: 10/13/19 RN 16:55 10/11/19 ([email or web address]) (NOTE) SARS-CoV-2 target nucleic acids are DETECTED. SARS-CoV-2 RNA is generally detectable in upper respiratory specimens  during the acute phase of infection. Positive results are indicative of the presence of the identified virus, but do not rule out bacterial infection or co-infection with other pathogens not detected by the test. Clinical correlation with patient history and other diagnostic information is necessary to determine patient infection status. The expected result is Negative. Fact Sheet for Patients:  10/13/19 Fact Sheet for Healthcare Providers: https://www.moore.com/ This test is not yet approved or cleared by the https://www.young.biz/ FDA and  has been authorized for detection and/or diagnosis of SARS-CoV-2 by FDA under an Emergency  Use Authorization (EUA).  This EUA will remain in effect (meaning this test can be used)  for the duration of  the COVID-19 declaration under Section 564(b)(1) of the Act, 21 U.S.C. section 360bbb-3(b)(1), unless the authorization is terminated or revoked sooner.    Influenza A by PCR NEGATIVE NEGATIVE Final   Influenza B by PCR NEGATIVE NEGATIVE Final    Comment: (NOTE) The Xpert Xpress SARS-CoV-2/FLU/RSV assay is intended as an aid in  the diagnosis of influenza from Nasopharyngeal swab specimens and  should not be used as a sole basis for treatment. Nasal washings and  aspirates are unacceptable for Xpert Xpress SARS-CoV-2/FLU/RSV  testing. Fact Sheet for Patients: [place] Fact Sheet for Healthcare Providers: https://www.moore.com/ This test is not yet approved or cleared by the https://www.young.biz/ FDA and  has been authorized for detection and/or diagnosis of SARS-CoV-2 by  FDA under an Emergency Use Authorization (EUA). This EUA will remain  in effect (meaning this test can be used) for the duration of the  Covid-19 declaration under Section 564(b)(1) of the Act, 21  U.S.C. section 360bbb-3(b)(1), unless the authorization is  terminated or revoked. Performed at [facility], [place] [address] [place], [place] [address]   Surgical PCR screen     Status: None   Collection Time: 10/11/19  9:35 PM   Specimen: Nasal Mucosa; Nasal Swab  Result Value Ref Range Status   MRSA, PCR NEGATIVE NEGATIVE Final   Staphylococcus aureus NEGATIVE NEGATIVE Final    Comment: (NOTE) The Xpert SA Assay (FDA approved for NASAL specimens in patients 51 years of age and older), is one component of a comprehensive surveillance program. It is not intended to diagnose infection nor to guide or monitor treatment. Performed at [facility], [place] [address], [place], [place] [address]          Radiology Studies: DG Chest 1 View  Result  Date: 10/11/2019 CLINICAL DATA:  Fall.  Left leg pain. EXAM: CHEST  1 VIEW COMPARISON:  04/10/2016 FINDINGS: The heart size and mediastinal contours are within normal limits. Both lungs are clear. The visualized skeletal structures are unremarkable. IMPRESSION: No active disease. Electronically Signed   By: Nelson Chimes M.D.   On: 10/11/2019 14:21   CT Head Wo Contrast  Result Date: 10/11/2019 CLINICAL DATA:  Trauma, fall from bed EXAM: CT HEAD WITHOUT CONTRAST CT CERVICAL SPINE WITHOUT CONTRAST TECHNIQUE: Multidetector CT imaging of the head and cervical spine was performed following the standard protocol without intravenous contrast. Multiplanar CT image reconstructions of the cervical spine were also generated. COMPARISON:  None. FINDINGS: CT HEAD FINDINGS Brain: No evidence of acute infarction, hemorrhage, hydrocephalus, extra-axial collection or mass lesion/mass effect. Periventricular white matter hypodensity. Vascular: No hyperdense vessel or unexpected calcification. Skull: Normal. Negative for fracture or focal lesion. Sinuses/Orbits: No acute finding. Other: None. CT CERVICAL SPINE FINDINGS Alignment: Normal. Skull base and vertebrae: No acute fracture. No primary bone lesion or focal pathologic process. 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 Soft tissues and spinal canal: No prevertebral fluid or swelling. No visible canal hematoma. Disc levels: Moderate disc space height loss and osteophytosis of the lower cervical spine. Upper chest: Negative. Other: None. IMPRESSION: 1. No acute intracranial pathology. Small-vessel white matter disease. 2. No fracture or static subluxation of the cervical spine. Electronically Signed   By: Eddie Candle M.D.   On: 10/11/2019 15:54   CT Cervical Spine Wo Contrast  Result Date: 10/11/2019 CLINICAL DATA:  Trauma, fall from bed EXAM: CT HEAD WITHOUT CONTRAST CT CERVICAL SPINE WITHOUT CONTRAST TECHNIQUE: Multidetector CT imaging of the head  and cervical spine was performed following the standard protocol without intravenous contrast. Multiplanar CT image reconstructions of the cervical spine were also generated. COMPARISON:  None. FINDINGS: CT HEAD FINDINGS Brain: No evidence of acute infarction, hemorrhage, hydrocephalus, extra-axial collection or mass lesion/mass effect. Periventricular white matter hypodensity. Vascular: No hyperdense vessel or unexpected calcification. Skull: Normal. Negative for fracture or focal lesion. Sinuses/Orbits: No acute finding. Other: None. CT CERVICAL SPINE FINDINGS Alignment: Normal. Skull base and vertebrae: No acute fracture. No primary bone lesion or focal pathologic process. 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 Soft tissues and spinal canal: No prevertebral fluid or swelling. No visible canal hematoma. Disc levels: Moderate disc space height loss and osteophytosis of the lower cervical spine. Upper chest: Negative. Other: None. IMPRESSION: 1. No acute intracranial pathology. Small-vessel white matter disease. 2. No fracture or static subluxation of the cervical spine. Electronically  Signed   By: Lauralyn Primes M.D.   On: 10/11/2019 15:54   Pelvis Portable  Result Date: 10/12/2019 CLINICAL DATA:  Left femoral neck fracture. Status post left hemiarthroplasty. EXAM: PORTABLE PELVIS 1-2 VIEWS COMPARISON:  Radiographs dated 10/11/2019 FINDINGS: The patient has undergone left hemiarthroplasty. The prosthesis appears in excellent position in the AP projection. No fractures. Expected postsurgical air in the soft tissues. IMPRESSION: Satisfactory appearance of the left hip in the AP projection after hemiarthroplasty. Electronically Signed   By: Francene Boyers M.D.   On: 10/12/2019 17:10   DG C-Arm 1-60 Min  Result Date: 10/12/2019 CLINICAL DATA:  74 year old female with hip fracture EXAM: OPERATIVE LEFT HIP (WITH PELVIS IF PERFORMED)  VIEWS TECHNIQUE: Fluoroscopic spot image(s) were  submitted for interpretation post-operatively. COMPARISON:  None. FINDINGS: Intraoperative fluoroscopic spot images. Surgical changes of left hip arthroplasty. No immediate complicating features. IMPRESSION: Limited intraoperative fluoroscopic spot images of left hip arthroplasty. Please refer to the dictated operative report for full details of intraoperative findings and procedure. Electronically Signed   By: Gilmer Mor D.O.   On: 10/12/2019 16:11   DG HIP OPERATIVE UNILAT WITH PELVIS LEFT  Result Date: 10/12/2019 CLINICAL DATA:  74 year old female with hip fracture EXAM: OPERATIVE LEFT HIP (WITH PELVIS IF PERFORMED)  VIEWS TECHNIQUE: Fluoroscopic spot image(s) were submitted for interpretation post-operatively. COMPARISON:  None. FINDINGS: Intraoperative fluoroscopic spot images. Surgical changes of left hip arthroplasty. No immediate complicating features. IMPRESSION: Limited intraoperative fluoroscopic spot images of left hip arthroplasty. Please refer to the dictated operative report for full details of intraoperative findings and procedure. Electronically Signed   By: Gilmer Mor D.O.   On: 10/12/2019 16:11   DG Hip Unilat W or Wo Pelvis 2-3 Views Left  Result Date: 10/11/2019 CLINICAL DATA:  Left hip pain after fall. EXAM: DG HIP (WITH OR WITHOUT PELVIS) 2-3V LEFT COMPARISON:  None. FINDINGS: Acute displaced fracture of the left femoral neck with varus and apex anterior angulation. No additional fracture. No dislocation. The pubic symphysis and sacroiliac joints are intact. Soft tissues are unremarkable. IMPRESSION: 1. Acute displaced and angulated left femoral neck fracture. Electronically Signed   By: Obie Dredge M.D.   On: 10/11/2019 14:21        Scheduled Meds: . acetaminophen  500 mg Oral Q6H  . albuterol  2 puff Inhalation Q6H  . vitamin C  500 mg Oral Daily  . benztropine  1 mg Oral Daily  . divalproex  250 mg Oral q morning - 10a   And  . divalproex  500 mg Oral QHS  .  docusate sodium  100 mg Oral BID  . enoxaparin (LOVENOX) injection  40 mg Subcutaneous Q24H  . FLUoxetine  20 mg Oral Daily  . gabapentin  300 mg Oral TID  . hydrochlorothiazide  12.5 mg Oral Daily  . lisinopril  10 mg Oral Daily  . mupirocin ointment  1 application Nasal BID  . pravastatin  20 mg Oral Daily  . zinc sulfate  220 mg Oral Daily   Continuous Infusions: . sodium chloride Stopped (10/13/19 0737)  . methocarbamol (ROBAXIN) IV            Glade Lloyd, MD [facility] 10/13/2019, 7:58 AM

## 2019-10-13 NOTE — TOC Initial Note (Signed)
Transition of Care [facility]) - Initial/Assessment Note    Patient Details  Name: Joan Nash MRN: [id number] Date of Birth: 1946-01-13  Transition of Care [facility]) CM/SW Contact:    Cherylann Parr, RN Phone Number: 10/13/2019, 4:42 PM  Clinical Narrative:    Pt is from a group home.  Pt has baseline hx of schizophrenia, depression and dementia.  Pt is post fall and now recommended for SNF.   CM reached out to pts niece [place] who is also documented as POA.  Jerline Pain is in agreement with pt discharging to a SNF for short term rehab.  CM explained the process for SNF and informed niece that there are limited SNF's in the area that accept COVID.  CM will leave handoff for weekend [facility] department to initiate SNF process     Expected Discharge Plan: Skilled Nursing Facility     Patient Goals and CMS Choice     Choice offered to / list presented to : [facility] POA / Guardian(Pts Niece Cromartie,Glinda)  Expected Discharge Plan and Services Expected Discharge Plan: Skilled Nursing Facility       Living arrangements for the past 2 months: Group Home                                      Prior Living Arrangements/Services Living arrangements for the past 2 months: Group Home   Patient language and need for interpreter reviewed:: Yes        Need for Family Participation in Patient Care: Yes (Comment) Care giver support system in place?: Yes (comment)   Criminal Activity/Legal Involvement Pertinent to Current Situation/Hospitalization: No - Comment as needed  Activities of Daily Living      Permission Sought/Granted                  Emotional Assessment       Orientation: : Fluctuating Orientation (Suspected and/or reported Sundowners)      Admission diagnosis:  Fall [W19.XXXA] Left displaced femoral neck fracture (HCC) [S72.002A] Fall, initial encounter [W19.XXXA] Closed right hip fracture, initial encounter (HCC) [S72.001A] Hip fracture requiring operative  repair, left, closed, initial encounter [facility]) [S72.002A] Patient Active Problem List   Diagnosis Date Noted  . Left displaced femoral neck fracture (HCC) 10/11/2019  . Fall   . Dementia with behavioral disturbance (HCC) 04/12/2016  . Encounter for preadmission testing   . Schizoaffective disorder (HCC) 01/29/2015   PCP:  Arnette Felts, FNP Pharmacy:   [facility] [phone] - [place], [place] - [address] AT [facility] [address] [place] [address] Phone: [phone] Fax: [phone]     Social Determinants of Health (SDOH) Interventions    Readmission Risk Interventions No flowsheet data found.

## 2019-10-14 LAB — COMPREHENSIVE METABOLIC PANEL
ALT: 23 U/L (ref 0–44)
AST: 21 U/L (ref 15–41)
Albumin: 2.2 g/dL — ABNORMAL LOW (ref 3.5–5.0)
Alkaline Phosphatase: 44 U/L (ref 38–126)
Anion gap: 11 (ref 5–15)
BUN: 21 mg/dL (ref 8–23)
CO2: 29 mmol/L (ref 22–32)
Calcium: 9.4 mg/dL (ref 8.9–10.3)
Chloride: 102 mmol/L (ref 98–111)
Creatinine, Ser: 0.65 mg/dL (ref 0.44–1.00)
GFR calc Af Amer: >60 mL/min (ref >60)
GFR calc non Af Amer: >60 mL/min (ref >60)
Glucose, Bld: 107 mg/dL — ABNORMAL HIGH (ref 70–99)
Potassium: 3.5 mmol/L (ref 3.5–5.1)
Sodium: 142 mmol/L (ref 135–145)
Total Bilirubin: 0.3 mg/dL (ref 0.3–1.2)
Total Protein: 6.3 g/dL — ABNORMAL LOW (ref 6.5–8.1)

## 2019-10-14 LAB — CBC
HCT: 40.4 % (ref 36.0–46.0)
Hemoglobin: 13.2 g/dL (ref 12.0–15.0)
MCH: 29.6 pg (ref 26.0–34.0)
MCHC: 32.7 g/dL (ref 30.0–36.0)
MCV: 90.6 fL (ref 80.0–100.0)
Platelets: 186 10*3/uL (ref 150–400)
RBC: 4.46 MIL/uL (ref 3.87–5.11)
RDW: 14.7 % (ref 11.5–15.5)
WBC: 8.6 10*3/uL (ref 4.0–10.5)
nRBC: 0 % (ref 0.0–0.2)

## 2019-10-14 LAB — MAGNESIUM: Magnesium: 2 mg/dL (ref 1.7–2.4)

## 2019-10-14 LAB — FERRITIN: Ferritin: 304 ng/mL (ref 11–307)

## 2019-10-14 LAB — C-REACTIVE PROTEIN: CRP: 27.7 mg/dL — ABNORMAL HIGH (ref <1.0)

## 2019-10-14 LAB — D-DIMER, QUANTITATIVE: D-Dimer, Quant: 3.84 ug/mL-FEU — ABNORMAL HIGH (ref 0.00–0.50)

## 2019-10-14 MED ORDER — ALBUTEROL SULFATE HFA 108 (90 BASE) MCG/ACT IN AERS
2.0000 | INHALATION_SPRAY | Freq: Four times a day (QID) | RESPIRATORY_TRACT | Status: DC | PRN
  Filled 2019-10-14: qty 6.7

## 2019-10-14 NOTE — Progress Notes (Signed)
Patient ID: Joan Nash, female   DOB: 1946/01/31, 74 y.o.   MRN: [id number]  PROGRESS NOTE    Joan Nash  YJE:[id number] DOB: 20-Oct-1945 DOA: 10/11/2019 PCP: Arnette Felts, FNP   Brief Narrative:  74 year old female with history of hypertension, schizophrenia, depression, dementia who is a group home resident presented with fall and was found to have left hip fracture.  Orthopedics was consulted.  COVID-19 tested positive.  Assessment & Plan:   Closed left femoral neck fracture secondary to mechanical fall -Fall precautions.  Pain management.   -Status post surgical intervention on 10/12/2019 by orthopedics.  Wound care as per orthopedics recommendations. -PT/OT recommend SNF placement.  Social worker consult.  Asymptomatic COVID-19 infection -Tested positive for COVID-19.  No evidence of hypoxia.  Chest x-ray with no signs of infiltrate.   -Will monitor inflammatory markers.  COVID-19 Labs  Recent Labs    10/11/19 1731 10/12/19 0324 10/13/19 0417 10/14/19 0658  DDIMER  --  7.37* 7.32* 3.84*  FERRITIN 150 226 405*  --   LDH 573*  --   --   --   CRP 7.4* 14.0* 17.8*  --     Lab Results  Component Value Date   SARSCOV2NAA POSITIVE (A) 10/11/2019   D-dimer improving.  Repeat chest x-ray on 10/13/2019 was negative for infiltrate. currently not on steroids or remdesivir.  Schizophrenia -Stable.  Continue outpatient regimen  Dementia -Monitor mental status.  Fall precautions  Hypertension -Blood pressure stable.  Continue lisinopril and hydrochlorothiazide  Thrombocytopenia -Questionable cause.  Monitor  Stage II sacral pressure injury: Present on admission -Continue local wound care.   DVT prophylaxis: Heparin Code Status: Full Family Communication: None at bedside Disposition Plan: SNF once bed is available.  Patient is medically stable for SNF discharge  Consultants: Orthopedic surgery  Procedures: Left hip hemiarthroplasty on 10/12/2019  Antimicrobials:  Perioperative  Subjective: Patient seen and examined at bedside.  Poor historian.  Pleasantly confused.  No overnight fever or vomiting reported. Objective: Vitals:   10/13/19 0352 10/13/19 0910 10/13/19 1647 10/13/19 2329  BP: 135/80 (!) 146/80 118/75 123/75  Pulse: 93 98 100 94  Resp:  17 (!) 22 16  Temp: 98.2 F (36.8 C) 98.4 F (36.9 C) 98 F (36.7 C) 97.8 F (36.6 C)  TempSrc: Oral Axillary    SpO2:  98% 91%    No intake or output data in the 24 hours ending 10/14/19 0737 There were no vitals filed for this visit.  Examination:  General exam: No acute distress.  Awake but pleasantly confused. Respiratory system: Bilateral decreased breath sounds at bases with some crackles.  No wheezing  cardiovascular system: S1-S2 heard, rate controlled Gastrointestinal system: Abdomen is nondistended, soft and nontender. Normal bowel sounds heard. Extremities: No cyanosis, edema   Data Reviewed: I have personally reviewed following labs and imaging studies  CBC: Recent Labs  Lab 10/11/19 1430 10/12/19 0324 10/12/19 1909 10/13/19 1117 10/14/19 0658  WBC 5.1 4.6 8.0 8.7 8.6  NEUTROABS 3.5  --   --   --   --   HGB 13.5 12.5 13.7 14.2 13.2  HCT 42.5 38.9 43.6 45.0 40.4  MCV 93.6 91.7 92.6 92.8 90.6  PLT 152 146* 163 205 186   Basic Metabolic Panel: Recent Labs  Lab 10/11/19 1430 10/12/19 0324 10/12/19 1909 10/13/19 0417  NA 146* 146*  --  140  K 4.0 3.4*  --  5.1  CL 109 108  --  102  CO2 27 29  --  25  GLUCOSE 100* 102*  --  108*  BUN 20 21  --  22  CREATININE 0.66 0.60 0.64 0.64  CALCIUM 9.7 9.4  --  9.0  MG  --  1.9  --  2.1  PHOS  --  4.0  --   --    GFR: CrCl cannot be calculated (Unknown ideal weight.). Liver Function Tests: Recent Labs  Lab 10/11/19 1430 10/12/19 0324 10/13/19 0417  AST 38 27 40  ALT 27 26 23   ALKPHOS 38 35* 40  BILITOT 0.7 0.7 1.8*  PROT 7.5 6.7 6.5  ALBUMIN 3.1* 2.6* 2.5*   No results for input(s): LIPASE, AMYLASE in the last  168 hours. No results for input(s): AMMONIA in the last 168 hours. Coagulation Profile: Recent Labs  Lab 10/11/19 1430  INR 1.1   Cardiac Enzymes: No results for input(s): CKTOTAL, CKMB, CKMBINDEX, TROPONINI in the last 168 hours. BNP (last 3 results) No results for input(s): PROBNP in the last 8760 hours. HbA1C: No results for input(s): HGBA1C in the last 72 hours. CBG: Recent Labs  Lab 10/12/19 1715  GLUCAP 117*   Lipid Profile: No results for input(s): CHOL, HDL, LDLCALC, TRIG, CHOLHDL, LDLDIRECT in the last 72 hours. Thyroid Function Tests: No results for input(s): TSH, T4TOTAL, FREET4, T3FREE, THYROIDAB in the last 72 hours. Anemia Panel: Recent Labs    10/12/19 0324 10/13/19 0417  FERRITIN 226 405*   Sepsis Labs: No results for input(s): PROCALCITON, LATICACIDVEN in the last 168 hours.  Recent Results (from the past 240 hour(s))  Respiratory Panel by RT PCR (Flu A&B, Covid) - Nasopharyngeal Swab     Status: Abnormal   Collection Time: 10/11/19  3:37 PM   Specimen: Nasopharyngeal Swab  Result Value Ref Range Status   SARS Coronavirus 2 by RT PCR POSITIVE (A) NEGATIVE Final    Comment: RESULT CALLED TO, READ BACK BY AND VERIFIED WITH: 10/13/19 RN 16:55 10/11/19 (wilsonm) (NOTE) SARS-CoV-2 target nucleic acids are DETECTED. SARS-CoV-2 RNA is generally detectable in upper respiratory specimens  during the acute phase of infection. Positive results are indicative of the presence of the identified virus, but do not rule out bacterial infection or co-infection with other pathogens not detected by the test. Clinical correlation with patient history and other diagnostic information is necessary to determine patient infection status. The expected result is Negative. Fact Sheet for Patients:  10/13/19 Fact Sheet for Healthcare Providers: https://www.moore.com/ This test is not yet approved or cleared by the https://www.young.biz/ FDA and  has been authorized for detection and/or diagnosis of SARS-CoV-2 by FDA under an Emergency Use Authorization (EUA).  This EUA will remain in effect (meaning this test can be used)  for the duration of  the COVID-19 declaration under Section 564(b)(1) of the Act, 21 U.S.C. section 360bbb-3(b)(1), unless the authorization is terminated or revoked sooner.    Influenza A by PCR NEGATIVE NEGATIVE Final   Influenza B by PCR NEGATIVE NEGATIVE Final    Comment: (NOTE) The Xpert Xpress SARS-CoV-2/FLU/RSV assay is intended as an aid in  the diagnosis of influenza from Nasopharyngeal swab specimens and  should not be used as a sole basis for treatment. Nasal washings and  aspirates are unacceptable for Xpert Xpress SARS-CoV-2/FLU/RSV  testing. Fact Sheet for Patients: [place] Fact Sheet for Healthcare Providers: https://www.moore.com/ This test is not yet approved or cleared by the https://www.young.biz/ FDA and  has been authorized for detection and/or diagnosis of SARS-CoV-2 by  FDA under  an Emergency Use Authorization (EUA). This EUA will remain  in effect (meaning this test can be used) for the duration of the  Covid-19 declaration under Section 564(b)(1) of the Act, 21  U.S.C. section 360bbb-3(b)(1), unless the authorization is  terminated or revoked. Performed at [facility], [address], [place], [place] [address]   Surgical PCR screen     Status: None   Collection Time: 10/11/19  9:35 PM   Specimen: Nasal Mucosa; Nasal Swab  Result Value Ref Range Status   MRSA, PCR NEGATIVE NEGATIVE Final   Staphylococcus aureus NEGATIVE NEGATIVE Final    Comment: (NOTE) The Xpert SA Assay (FDA approved for NASAL specimens in patients 14 years of age and older), is one component of a comprehensive surveillance program. It is not intended to diagnose infection nor to guide or monitor treatment. Performed at [facility]  Lab, [address], [place], [place] [address]          Radiology Studies: Pelvis Portable  Result Date: 10/12/2019 CLINICAL DATA:  Left femoral neck fracture. Status post left hemiarthroplasty. EXAM: PORTABLE PELVIS 1-2 VIEWS COMPARISON:  Radiographs dated 10/11/2019 FINDINGS: The patient has undergone left hemiarthroplasty. The prosthesis appears in excellent position in the AP projection. No fractures. Expected postsurgical air in the soft tissues. IMPRESSION: Satisfactory appearance of the left hip in the AP projection after hemiarthroplasty. Electronically Signed   By: Francene Boyers M.D.   On: 10/12/2019 17:10   DG Chest Port 1 View  Result Date: 10/13/2019 CLINICAL DATA:  Worsening inflammatory markers, dyspnea, COVID-19 EXAM: PORTABLE CHEST 1 VIEW COMPARISON:  Portable exam 0816 hours compared to 10/11/2019 FINDINGS: Normal heart size, mediastinal contours, and pulmonary vascularity. Lungs clear. No pleural effusion or pneumothorax. Scattered endplate spur formation thoracic spine osseous demineralization noted. IMPRESSION: No acute abnormalities. Electronically Signed   By: Ulyses Southward M.D.   On: 10/13/2019 08:36   DG C-Arm 1-60 Min  Result Date: 10/12/2019 CLINICAL DATA:  73 year old female with hip fracture EXAM: OPERATIVE LEFT HIP (WITH PELVIS IF PERFORMED)  VIEWS TECHNIQUE: Fluoroscopic spot image(s) were submitted for interpretation post-operatively. COMPARISON:  None. FINDINGS: Intraoperative fluoroscopic spot images. Surgical changes of left hip arthroplasty. No immediate complicating features. IMPRESSION: Limited intraoperative fluoroscopic spot images of left hip arthroplasty. Please refer to the dictated operative report for full details of intraoperative findings and procedure. Electronically Signed   By: Gilmer Mor D.O.   On: 10/12/2019 16:11   DG HIP OPERATIVE UNILAT WITH PELVIS LEFT  Result Date: 10/12/2019 CLINICAL DATA:  74 year old female with hip fracture EXAM:  OPERATIVE LEFT HIP (WITH PELVIS IF PERFORMED)  VIEWS TECHNIQUE: Fluoroscopic spot image(s) were submitted for interpretation post-operatively. COMPARISON:  None. FINDINGS: Intraoperative fluoroscopic spot images. Surgical changes of left hip arthroplasty. No immediate complicating features. IMPRESSION: Limited intraoperative fluoroscopic spot images of left hip arthroplasty. Please refer to the dictated operative report for full details of intraoperative findings and procedure. Electronically Signed   By: Gilmer Mor D.O.   On: 10/12/2019 16:11        Scheduled Meds: . vitamin C  500 mg Oral Daily  . benztropine  1 mg Oral Daily  . divalproex  250 mg Oral q morning - 10a   And  . divalproex  500 mg Oral QHS  . docusate sodium  100 mg Oral BID  . enoxaparin (LOVENOX) injection  40 mg Subcutaneous Q24H  . FLUoxetine  20 mg Oral Daily  . gabapentin  300 mg  Oral TID  . hydrochlorothiazide  12.5 mg Oral Daily  . lisinopril  10 mg Oral Daily  . mupirocin ointment  1 application Nasal BID  . pravastatin  20 mg Oral Daily  . zinc sulfate  220 mg Oral Daily   Continuous Infusions: . sodium chloride Stopped (10/13/19 0737)  . methocarbamol (ROBAXIN) IV            Aline August, MD [facility] 10/14/2019, 7:37 AM

## 2019-10-15 LAB — COMPREHENSIVE METABOLIC PANEL
ALT: 30 U/L (ref 0–44)
AST: 23 U/L (ref 15–41)
Albumin: 2.1 g/dL — ABNORMAL LOW (ref 3.5–5.0)
Alkaline Phosphatase: 42 U/L (ref 38–126)
Anion gap: 10 (ref 5–15)
BUN: 21 mg/dL (ref 8–23)
CO2: 29 mmol/L (ref 22–32)
Calcium: 9.3 mg/dL (ref 8.9–10.3)
Chloride: 102 mmol/L (ref 98–111)
Creatinine, Ser: 0.52 mg/dL (ref 0.44–1.00)
GFR calc Af Amer: >60 mL/min (ref >60)
GFR calc non Af Amer: >60 mL/min (ref >60)
Glucose, Bld: 111 mg/dL — ABNORMAL HIGH (ref 70–99)
Potassium: 3.3 mmol/L — ABNORMAL LOW (ref 3.5–5.1)
Sodium: 141 mmol/L (ref 135–145)
Total Bilirubin: 0.7 mg/dL (ref 0.3–1.2)
Total Protein: 6.3 g/dL — ABNORMAL LOW (ref 6.5–8.1)

## 2019-10-15 LAB — CBC
HCT: 38.7 % (ref 36.0–46.0)
Hemoglobin: 12.6 g/dL (ref 12.0–15.0)
MCH: 29.7 pg (ref 26.0–34.0)
MCHC: 32.6 g/dL (ref 30.0–36.0)
MCV: 91.3 fL (ref 80.0–100.0)
Platelets: 251 10*3/uL (ref 150–400)
RBC: 4.24 MIL/uL (ref 3.87–5.11)
RDW: 14.8 % (ref 11.5–15.5)
WBC: 7.6 10*3/uL (ref 4.0–10.5)
nRBC: 0 % (ref 0.0–0.2)

## 2019-10-15 LAB — C-REACTIVE PROTEIN: CRP: 25.1 mg/dL — ABNORMAL HIGH (ref <1.0)

## 2019-10-15 LAB — FERRITIN: Ferritin: 389 ng/mL — ABNORMAL HIGH (ref 11–307)

## 2019-10-15 LAB — D-DIMER, QUANTITATIVE: D-Dimer, Quant: 5.64 ug/mL-FEU — ABNORMAL HIGH (ref 0.00–0.50)

## 2019-10-15 LAB — MAGNESIUM: Magnesium: 1.9 mg/dL (ref 1.7–2.4)

## 2019-10-15 MED ORDER — POTASSIUM CHLORIDE CRYS ER 20 MEQ PO TBCR
40.0000 meq | EXTENDED_RELEASE_TABLET | Freq: Once | ORAL | Status: AC
  Administered 2019-10-15: 40 meq via ORAL
  Filled 2019-10-15: qty 2

## 2019-10-15 NOTE — Progress Notes (Signed)
Patient ID: Joan Nash, female   DOB: 07/01/1946, 74 y.o.   MRN: [id number]  PROGRESS NOTE    Joan Nash  [id number]:[id number] DOB: [birth year] DOA: 10/11/2019 PCP: Arnette Felts, FNP   Brief Narrative:  74 year old female with history of hypertension, schizophrenia, depression, dementia who is a group home resident presented with fall and was found to have left hip fracture.  Orthopedics was consulted.  COVID-19 tested positive.  Assessment & Plan:   Closed left femoral neck fracture secondary to mechanical fall -Fall precautions.  Pain management.   -Status post surgical intervention on 10/12/2019 by orthopedics.  Wound care as per orthopedics recommendations. -PT/OT recommend SNF placement.  Social worker consulted.  Asymptomatic COVID-19 infection -Tested positive for COVID-19.  No evidence of hypoxia.  Chest x-ray with no signs of infiltrate.   -Will monitor inflammatory markers.  COVID-19 Labs  Recent Labs    10/13/19 0417 10/14/19 0658 10/15/19 0510  DDIMER 7.32* 3.84* 5.64*  FERRITIN 405* 304 389*  CRP 17.8* 27.7* 25.1*    Lab Results  Component Value Date   SARSCOV2NAA POSITIVE (A) 10/11/2019   D-dimer improving.  Repeat chest x-ray on 10/13/2019 was negative for infiltrate. currently not on steroids or remdesivir.  Schizophrenia -Stable.  Continue outpatient regimen  Dementia -Monitor mental status.  Fall precautions  Hypertension -Blood pressure stable.  Continue lisinopril and hydrochlorothiazide  Thrombocytopenia -Questionable cause.  Monitor  Stage II sacral pressure injury: Present on admission -Continue local wound care.   DVT prophylaxis: Heparin Code Status: Full Family Communication: None at bedside Disposition Plan: SNF once bed is available.  Patient is medically stable for SNF discharge  Consultants: Orthopedic surgery  Procedures: Left hip hemiarthroplasty on 10/12/2019  Antimicrobials: Perioperative  Subjective: Patient seen and  examined at bedside.  Poor historian.  She is pleasantly confused.  No worsening shortness of breath or vomiting or fever reported.   Objective: Vitals:   10/13/19 2329 10/14/19 0947 10/14/19 1731 10/14/19 2315  BP: 123/75 119/81 (!) 145/82 127/72  Pulse: 94 97 (!) 102 97  Resp: 16 16 18 14   Temp: 97.8 F (36.6 C) 97.8 F (36.6 C) 98.2 F (36.8 C) 98.4 F (36.9 C)  TempSrc:  Axillary Axillary Oral  SpO2:  100% 100% 90%    Intake/Output Summary (Last 24 hours) at 10/15/2019 0737 Last data filed at 10/14/2019 2300 Gross per 24 hour  Intake --  Output 800 ml  Net -800 ml   There were no vitals filed for this visit.  Examination:  General exam: No distress.  Pleasantly confused.   Respiratory system: Bilateral decreased breath sounds at bases with some scattered crackles  cardiovascular system: S1-S2 heard, intermittently tachycardic. Gastrointestinal system: Abdomen is nondistended, soft and nontender. Normal bowel sounds heard. Extremities: No cyanosis, edema   Data Reviewed: I have personally reviewed following labs and imaging studies  CBC: Recent Labs  Lab 10/11/19 1430 10/11/19 1430 10/12/19 0324 10/12/19 1909 10/13/19 1117 10/14/19 0658 10/15/19 0510  WBC 5.1   < > 4.6 8.0 8.7 8.6 7.6  NEUTROABS 3.5  --   --   --   --   --   --   HGB 13.5   < > 12.5 13.7 14.2 13.2 12.6  HCT 42.5   < > 38.9 43.6 45.0 40.4 38.7  MCV 93.6   < > 91.7 92.6 92.8 90.6 91.3  PLT 152   < > 146* 163 205 186 251   < > = values in this interval not  displayed.   Basic Metabolic Panel: Recent Labs  Lab 10/11/19 1430 10/11/19 1430 10/12/19 0324 10/12/19 1909 10/13/19 0417 10/14/19 0658 10/15/19 0510  NA 146*  --  146*  --  140 142 141  K 4.0  --  3.4*  --  5.1 3.5 3.3*  CL 109  --  108  --  102 102 102  CO2 27  --  29  --  25 29 29   GLUCOSE 100*  --  102*  --  108* 107* 111*  BUN 20  --  21  --  22 21 21   CREATININE 0.66   < > 0.60 0.64 0.64 0.65 0.52  CALCIUM 9.7  --  9.4  --   9.0 9.4 9.3  MG  --   --  1.9  --  2.1 2.0 1.9  PHOS  --   --  4.0  --   --   --   --    < > = values in this interval not displayed.   GFR: CrCl cannot be calculated (Unknown ideal weight.). Liver Function Tests: Recent Labs  Lab 10/11/19 1430 10/12/19 0324 10/13/19 0417 10/14/19 0658 10/15/19 0510  AST 38 27 40 21 23  ALT 27 26 23 23 30   ALKPHOS 38 35* 40 44 42  BILITOT 0.7 0.7 1.8* 0.3 0.7  PROT 7.5 6.7 6.5 6.3* 6.3*  ALBUMIN 3.1* 2.6* 2.5* 2.2* 2.1*   No results for input(s): LIPASE, AMYLASE in the last 168 hours. No results for input(s): AMMONIA in the last 168 hours. Coagulation Profile: Recent Labs  Lab 10/11/19 1430  INR 1.1   Cardiac Enzymes: No results for input(s): CKTOTAL, CKMB, CKMBINDEX, TROPONINI in the last 168 hours. BNP (last 3 results) No results for input(s): PROBNP in the last 8760 hours. HbA1C: No results for input(s): HGBA1C in the last 72 hours. CBG: Recent Labs  Lab 10/12/19 1715  GLUCAP 117*   Lipid Profile: No results for input(s): CHOL, HDL, LDLCALC, TRIG, CHOLHDL, LDLDIRECT in the last 72 hours. Thyroid Function Tests: No results for input(s): TSH, T4TOTAL, FREET4, T3FREE, THYROIDAB in the last 72 hours. Anemia Panel: Recent Labs    10/14/19 0658 10/15/19 0510  FERRITIN 304 389*   Sepsis Labs: No results for input(s): PROCALCITON, LATICACIDVEN in the last 168 hours.  Recent Results (from the past 240 hour(s))  Respiratory Panel by RT PCR (Flu A&B, Covid) - Nasopharyngeal Swab     Status: Abnormal   Collection Time: 10/11/19  3:37 PM   Specimen: Nasopharyngeal Swab  Result Value Ref Range Status   SARS Coronavirus 2 by RT PCR POSITIVE (A) NEGATIVE Final    Comment: RESULT CALLED TO, READ BACK BY AND VERIFIED WITH: 10/16/19 RN 16:55 10/11/19 (wilsonm) (NOTE) SARS-CoV-2 target nucleic acids are DETECTED. SARS-CoV-2 RNA is generally detectable in upper respiratory specimens  during the acute phase of infection. Positive  results are indicative of the presence of the identified virus, but do not rule out bacterial infection or co-infection with other pathogens not detected by the test. Clinical correlation with patient history and other diagnostic information is necessary to determine patient infection status. The expected result is Negative. Fact Sheet for Patients:  10/13/19 Fact Sheet for Healthcare Providers: Drema Halon This test is not yet approved or cleared by the 10/13/19 FDA and  has been authorized for detection and/or diagnosis of SARS-CoV-2 by FDA under an Emergency Use Authorization (EUA).  This EUA will remain in effect (meaning this test can  be used)  for the duration of  the COVID-19 declaration under Section 564(b)(1) of the Act, 21 U.S.C. section 360bbb-3(b)(1), unless the authorization is terminated or revoked sooner.    Influenza A by PCR NEGATIVE NEGATIVE Final   Influenza B by PCR NEGATIVE NEGATIVE Final    Comment: (NOTE) The Xpert Xpress SARS-CoV-2/FLU/RSV assay is intended as an aid in  the diagnosis of influenza from Nasopharyngeal swab specimens and  should not be used as a sole basis for treatment. Nasal washings and  aspirates are unacceptable for Xpert Xpress SARS-CoV-2/FLU/RSV  testing. Fact Sheet for Patients: PinkCheek.be Fact Sheet for Healthcare Providers: GravelBags.it This test is not yet approved or cleared by the [place] FDA and  has been authorized for detection and/or diagnosis of SARS-CoV-2 by  FDA under an Emergency Use Authorization (EUA). This EUA will remain  in effect (meaning this test can be used) for the duration of the  Covid-19 declaration under Section 564(b)(1) of the Act, 21  U.S.C. section 360bbb-3(b)(1), unless the authorization is  terminated or revoked. Performed at [facility], [place] [address],  [place], [place] [address]   Surgical PCR screen     Status: None   Collection Time: 10/11/19  9:35 PM   Specimen: Nasal Mucosa; Nasal Swab  Result Value Ref Range Status   MRSA, PCR NEGATIVE NEGATIVE Final   Staphylococcus aureus NEGATIVE NEGATIVE Final    Comment: (NOTE) The Xpert SA Assay (FDA approved for NASAL specimens in patients 76 years of age and older), is one component of a comprehensive surveillance program. It is not intended to diagnose infection nor to guide or monitor treatment. Performed at [facility], [place] [address] [place], [place] [address]          Radiology Studies: DG Chest Port 1 View  Result Date: 10/13/2019 CLINICAL DATA:  Worsening inflammatory markers, dyspnea, COVID-19 EXAM: PORTABLE CHEST 1 VIEW COMPARISON:  Portable exam 0816 hours compared to 10/11/2019 FINDINGS: Normal heart size, mediastinal contours, and pulmonary vascularity. Lungs clear. No pleural effusion or pneumothorax. Scattered endplate spur formation thoracic spine osseous demineralization noted. IMPRESSION: No acute abnormalities. Electronically Signed   By: Lavonia Dana M.D.   On: 10/13/2019 08:36        Scheduled Meds: . vitamin C  500 mg Oral Daily  . benztropine  1 mg Oral Daily  . divalproex  250 mg Oral q morning - 10a   And  . divalproex  500 mg Oral QHS  . docusate sodium  100 mg Oral BID  . enoxaparin (LOVENOX) injection  40 mg Subcutaneous Q24H  . FLUoxetine  20 mg Oral Daily  . gabapentin  300 mg Oral TID  . hydrochlorothiazide  12.5 mg Oral Daily  . lisinopril  10 mg Oral Daily  . mupirocin ointment  1 application Nasal BID  . pravastatin  20 mg Oral Daily  . zinc sulfate  220 mg Oral Daily   Continuous Infusions: . methocarbamol (ROBAXIN) IV            Aline August, MD [facility] 10/15/2019, 7:37 AM

## 2019-10-15 NOTE — TOC Progression Note (Signed)
Transition of Care [facility]) - Progression Note    Patient Details  Name: Joan Nash MRN: [id number] Date of Birth: 1946-08-18  [facility] CM/SW Contact  Nada Boozer Jimma Ortman, LCSWA Phone Number: 10/15/2019, 11:52 AM  Clinical Narrative:     CSW has completed workup for the patient. Patient had to change her PASSR number to go to a SNF.  CSW has faxed requested clinicals to [facility]. CSW has faxed the patient out to COVID positive facilities.   CSW will provide bed offers to the patient's guardian as they become available. CSW will continue to follow and assist with discharge planning.   Expected Discharge Plan: Skilled Nursing Facility    Expected Discharge Plan and Services Expected Discharge Plan: Skilled Nursing Facility       Living arrangements for the past 2 months: Group Home                                       Social Determinants of Health (SDOH) Interventions    Readmission Risk Interventions No flowsheet data found.

## 2019-10-15 NOTE — [place] FL2 (Addendum)
[place] MEDICAID FL2 LEVEL OF CARE SCREENING TOOL     IDENTIFICATION  Patient Name: Joan Nash Birthdate: 06-28-1946 Sex: female Admission Date (Current Location): 10/11/2019  [facility] and [place] Number:  Herbalist and Address:  The . [facility], [place] [address] [place], [place] [address]      Provider Number: [id number]  Attending Physician Name and Address:  Aline August, MD  Relative Name and Phone Number:  Theophilus Kinds, Relative/POA, [phone]    Current Level of Care: Hospital Recommended Level of Care: [place] Prior Approval Number:    Date Approved/Denied:   PASRR Number: [id number]  Discharge Plan: SNF    Current Diagnoses: Patient Active Problem List   Diagnosis Date Noted  . Left displaced femoral neck fracture ([place]) 10/11/2019  . Fall   . Dementia with behavioral disturbance ([place]) 04/12/2016  . Encounter for preadmission testing   . Schizoaffective disorder ([place]) 01/29/2015    Orientation RESPIRATION BLADDER Height & Weight     Self  Normal Continent, External catheter Weight:   Height:     BEHAVIORAL SYMPTOMS/MOOD NEUROLOGICAL BOWEL NUTRITION STATUS      Incontinent Diet(heart health diet, thin liquids)  AMBULATORY STATUS COMMUNICATION OF NEEDS Skin   Extensive Assist Verbally Skin abrasions, Surgical wounds, Other (Comment)(skin cracking on foot, pressure injury on sacrum, closed incicion on left hip)   PU Stage 2 Dressing: (sacrum)                   Personal Care Assistance Level of Assistance  Bathing, Feeding, Dressing Bathing Assistance: Maximum assistance Feeding assistance: Limited assistance Dressing Assistance: Maximum assistance     Functional Limitations Info  Sight, Hearing, Speech Sight Info: Adequate Hearing Info: Adequate Speech Info: Adequate    SPECIAL CARE FACTORS FREQUENCY  PT (By licensed PT), OT (By licensed OT)     PT Frequency: 5x/wk OT Frequency:  5x/wk            Contractures Contractures Info: Not present    Additional Factors Info  Code Status, Allergies, Isolation Precautions, Psychotropic Code Status Info: Full Code Allergies Info: Penicillins Psychotropic Info: depakote sprinkle 250mg  in the morning and 500mg  in the evening.   Isolation Precautions Info: COVID +     Current Medications (10/15/2019):  This is the current hospital active medication list Current Facility-Administered Medications  Medication Dose Route Frequency Provider Last Rate Last Admin  . acetaminophen (TYLENOL) tablet 650 mg  650 mg Oral Q6H PRN Leandrew Koyanagi, MD       Or  . acetaminophen (TYLENOL) suppository 650 mg  650 mg Rectal Q6H PRN Leandrew Koyanagi, MD      . acetaminophen (TYLENOL) tablet 325-650 mg  325-650 mg Oral Q6H PRN Leandrew Koyanagi, MD      . albuterol (VENTOLIN HFA) 108 (90 Base) MCG/ACT inhaler 2 puff  2 puff Inhalation Q6H PRN Aline August, MD      . alum & mag hydroxide-simeth (MAALOX/MYLANTA) 200-200-20 MG/5ML suspension 30 mL  30 mL Oral Q4H PRN Leandrew Koyanagi, MD      . ascorbic acid (VITAMIN C) tablet 500 mg  500 mg Oral Daily Leandrew Koyanagi, MD   500 mg at 10/15/19 1001  . benztropine (COGENTIN) tablet 1 mg  1 mg Oral Daily Leandrew Koyanagi, MD   1 mg at 10/15/19 1001  . clonazePAM (KLONOPIN) tablet 0.5 mg  0.5 mg Oral BID PRN Leandrew Koyanagi, MD  0.5 mg at 10/11/19 2232  . divalproex (DEPAKOTE SPRINKLE) capsule 250 mg  250 mg Oral q morning - 10a Tarry Kos, MD   250 mg at 10/15/19 1016   And  . divalproex (DEPAKOTE SPRINKLE) capsule 500 mg  500 mg Oral QHS Tarry Kos, MD   500 mg at 10/14/19 2117  . docusate sodium (COLACE) capsule 100 mg  100 mg Oral BID Tarry Kos, MD   100 mg at 10/15/19 0959  . enoxaparin (LOVENOX) injection 40 mg  40 mg Subcutaneous Q24H Tarry Kos, MD   40 mg at 10/14/19 1718  . FLUoxetine (PROZAC) capsule 20 mg  20 mg Oral Daily Tarry Kos, MD   20 mg at 10/15/19 0959  . gabapentin  (NEURONTIN) capsule 300 mg  300 mg Oral TID Tarry Kos, MD   300 mg at 10/15/19 1001  . guaiFENesin-dextromethorphan (ROBITUSSIN DM) 100-10 MG/5ML syrup 10 mL  10 mL Oral Q4H PRN Tarry Kos, MD      . haloperidol (HALDOL) tablet 0.5 mg  0.5 mg Oral BID PRN Tarry Kos, MD      . hydrochlorothiazide (MICROZIDE) capsule 12.5 mg  12.5 mg Oral Daily Tarry Kos, MD   12.5 mg at 10/15/19 1001  . HYDROcodone-acetaminophen (NORCO) 7.5-325 MG per tablet 1-2 tablet  1-2 tablet Oral Q4H PRN Tarry Kos, MD      . HYDROcodone-acetaminophen (NORCO/VICODIN) 5-325 MG per tablet 1-2 tablet  1-2 tablet Oral Q4H PRN Tarry Kos, MD   1 tablet at 10/13/19 0459  . lip balm (CARMEX) ointment   Topical PRN Glade Lloyd, MD   1 application at 10/12/19 2100  . lisinopril (ZESTRIL) tablet 10 mg  10 mg Oral Daily Tarry Kos, MD   10 mg at 10/15/19 1000  . magnesium citrate solution 1 Bottle  1 Bottle Oral Once PRN Tarry Kos, MD      . menthol-cetylpyridinium (CEPACOL) lozenge 3 mg  1 lozenge Oral PRN Tarry Kos, MD       Or  . phenol (CHLORASEPTIC) mouth spray 1 spray  1 spray Mouth/Throat PRN Tarry Kos, MD      . methocarbamol (ROBAXIN) tablet 500 mg  500 mg Oral Q6H PRN Tarry Kos, MD       Or  . methocarbamol (ROBAXIN) 500 mg in dextrose 5 % 50 mL IVPB  500 mg Intravenous Q6H PRN Tarry Kos, MD      . morphine 2 MG/ML injection 1 mg  1 mg Intravenous Q2H PRN Tarry Kos, MD      . mupirocin ointment (BACTROBAN) 2 % 1 application  1 application Nasal BID Tarry Kos, MD   1 application at 10/15/19 1001  . ondansetron (ZOFRAN) tablet 4 mg  4 mg Oral Q6H PRN Tarry Kos, MD       Or  . ondansetron [facility]) injection 4 mg  4 mg Intravenous Q6H PRN Tarry Kos, MD      . oxyCODONE (Oxy IR/ROXICODONE) immediate release tablet 5 mg  5 mg Oral Q4H PRN Tarry Kos, MD      . polyethylene glycol (MIRALAX / GLYCOLAX) packet 17 g  17 g Oral Daily PRN Tarry Kos, MD      .  pravastatin (PRAVACHOL) tablet 20 mg  20 mg Oral Daily Tarry Kos, MD   20 mg at 10/15/19 1000  . senna-docusate (Senokot-S) tablet  1 tablet  1 tablet Oral QHS PRN Tarry Kos, MD      . sorbitol 70 % solution 30 mL  30 mL Oral Daily PRN Tarry Kos, MD      . zinc sulfate capsule 220 mg  220 mg Oral Daily Tarry Kos, MD   220 mg at 10/15/19 1000     Discharge Medications: Please see discharge summary for a list of discharge medications.  Relevant Imaging Results:  Relevant Lab Results:   Additional Information SSN: [id number]; COVID +  Caitlin B Pinion, LCSWA

## 2019-10-16 LAB — CBC WITH DIFFERENTIAL/PLATELET
Abs Immature Granulocytes: 0.12 10*3/uL — ABNORMAL HIGH (ref 0.00–0.07)
Basophils Absolute: 0 10*3/uL (ref 0.0–0.1)
Basophils Relative: 0 %
Eosinophils Absolute: 0.2 10*3/uL (ref 0.0–0.5)
Eosinophils Relative: 3 %
HCT: 37.4 % (ref 36.0–46.0)
Hemoglobin: 12.1 g/dL (ref 12.0–15.0)
Immature Granulocytes: 2 %
Lymphocytes Relative: 19 %
Lymphs Abs: 1.2 10*3/uL (ref 0.7–4.0)
MCH: 29 pg (ref 26.0–34.0)
MCHC: 32.4 g/dL (ref 30.0–36.0)
MCV: 89.7 fL (ref 80.0–100.0)
Monocytes Absolute: 1.1 10*3/uL — ABNORMAL HIGH (ref 0.1–1.0)
Monocytes Relative: 17 %
Neutro Abs: 3.7 10*3/uL (ref 1.7–7.7)
Neutrophils Relative %: 59 %
Platelets: 301 10*3/uL (ref 150–400)
RBC: 4.17 MIL/uL (ref 3.87–5.11)
RDW: 14.7 % (ref 11.5–15.5)
WBC: 6.3 10*3/uL (ref 4.0–10.5)
nRBC: 0 % (ref 0.0–0.2)

## 2019-10-16 LAB — COMPREHENSIVE METABOLIC PANEL
ALT: 39 U/L (ref 0–44)
AST: 28 U/L (ref 15–41)
Albumin: 1.9 g/dL — ABNORMAL LOW (ref 3.5–5.0)
Alkaline Phosphatase: 45 U/L (ref 38–126)
Anion gap: 10 (ref 5–15)
BUN: 14 mg/dL (ref 8–23)
CO2: 29 mmol/L (ref 22–32)
Calcium: 9.4 mg/dL (ref 8.9–10.3)
Chloride: 99 mmol/L (ref 98–111)
Creatinine, Ser: 0.46 mg/dL (ref 0.44–1.00)
GFR calc Af Amer: >60 mL/min (ref >60)
GFR calc non Af Amer: >60 mL/min (ref >60)
Glucose, Bld: 103 mg/dL — ABNORMAL HIGH (ref 70–99)
Potassium: 3.9 mmol/L (ref 3.5–5.1)
Sodium: 138 mmol/L (ref 135–145)
Total Bilirubin: 0.7 mg/dL (ref 0.3–1.2)
Total Protein: 5.9 g/dL — ABNORMAL LOW (ref 6.5–8.1)

## 2019-10-16 LAB — D-DIMER, QUANTITATIVE: D-Dimer, Quant: 3.75 ug/mL-FEU — ABNORMAL HIGH (ref 0.00–0.50)

## 2019-10-16 LAB — MAGNESIUM: Magnesium: 1.7 mg/dL (ref 1.7–2.4)

## 2019-10-16 LAB — FERRITIN: Ferritin: 390 ng/mL — ABNORMAL HIGH (ref 11–307)

## 2019-10-16 LAB — C-REACTIVE PROTEIN: CRP: 19.6 mg/dL — ABNORMAL HIGH (ref <1.0)

## 2019-10-16 NOTE — Progress Notes (Signed)
Physical Therapy Treatment Patient Details Name: Joan Nash MRN: [id number] DOB: 16-Mar-1946 Today's Date: 10/16/2019    History of Present Illness 74 year old female with history of hypertension, schizophrenia, depression, dementia who is a group home resident presented with fall and was found to have left hip fracture.  Orthopedics was consulted.  COVID-19 tested positive.  Underwent direct anterior approach left hip hemiarthoplasty.     PT Comments    Patient not progressing this session.  Able to get up to recliner last session with +2 A, this session multiple attempts to stand erect from bed with +2 A and pt unable to get up on her feet enough to take steps.  Did participate in supine therex.  Feel she will continue to benefit from skilled PT in the acute setting prior to d/c to STSNF level rehab.    Follow Up Recommendations  SNF;Supervision/Assistance - 24 hour     Equipment Recommendations  Other (comment)(TBA)    Recommendations for Other Services       Precautions / Restrictions Precautions Precautions: Fall Restrictions Weight Bearing Restrictions: Yes LLE Weight Bearing: Weight bearing as tolerated    Mobility  Bed Mobility Overal bed mobility: Needs Assistance Bed Mobility: Supine to Sit     Supine to sit: HOB elevated;Max assist     General bed mobility comments: assist to move legs off bed and to lift trunk  Transfers Overall transfer level: Needs assistance Equipment used: Rolling walker (2 wheeled) Transfers: Sit to/from Stand Sit to Stand: Max assist;+2 physical assistance;From elevated surface         General transfer comment: sit to stand/squat x 3 to RW with max cues for hip extension, positioning feet while siddint and for balance as leans posterior.  Unable to get up enough to attempt steps with RN in room helping  Ambulation/Gait                 Stairs             Wheelchair Mobility    Modified Rankin (Stroke Patients  Only)       Balance Overall balance assessment: Needs assistance Sitting-balance support: Feet supported Sitting balance-Leahy Scale: Poor Sitting balance - Comments: initially leaining posterior, progressed to S level for balance at EOB Postural control: Posterior lean Standing balance support: Bilateral upper extremity supported Standing balance-Leahy Scale: Zero Standing balance comment: UE support and max A for standing long enough for RN to place Mepliex sacral pad on pt                            Cognition Arousal/Alertness: Awake/alert Behavior During Therapy: WFL for tasks assessed/performed Overall Cognitive Status: History of cognitive impairments - at baseline                                 General Comments: Patient slow to follow commands, needs multimodal cues and assist      Exercises General Exercises - Lower Extremity Ankle Circles/Pumps: AAROM;AROM;5 reps;Supine Short Arc Quad: AAROM;5 reps;Left;Supine Heel Slides: AAROM;5 reps;Supine;Both;AROM    General Comments        Pertinent Vitals/Pain Pain Assessment: Faces Faces Pain Scale: Hurts little more Pain Location: left LE with movement Pain Descriptors / Indicators: Grimacing;Guarding Pain Intervention(s): Monitored during session;Repositioned;Limited activity within patient's tolerance    Home Living  Prior Function            PT Goals (current goals can now be found in the care plan section) Progress towards PT goals: Not progressing toward goals - comment    Frequency    Min 2X/week      PT Plan Current plan remains appropriate    Co-evaluation              AM-PAC PT "6 Clicks" Mobility   Outcome Measure  Help needed turning from your back to your side while in a flat bed without using bedrails?: Total Help needed moving from lying on your back to sitting on the side of a flat bed without using bedrails?: Total Help  needed moving to and from a bed to a chair (including a wheelchair)?: Total Help needed standing up from a chair using your arms (e.g., wheelchair or bedside chair)?: Total Help needed to walk in hospital room?: Total Help needed climbing 3-5 steps with a railing? : Total 6 Click Score: 6    End of Session Equipment Utilized During Treatment: Gait belt Activity Tolerance: Patient limited by fatigue Patient left: in bed;with call bell/phone within reach;with bed alarm set   PT Visit Diagnosis: Unsteadiness on feet (R26.81);Muscle weakness (generalized) (M62.81);Pain Pain - Right/Left: Left Pain - part of body: Hip     Time: 2774-1287 PT Time Calculation (min) (ACUTE ONLY): 23 min  Charges:  $Therapeutic Exercise: 8-22 mins $Therapeutic Activity: 8-22 mins                     Magda Kiel, [place] [facility] [phone] 10/16/2019    Reginia Naas 10/16/2019, 2:03 PM

## 2019-10-16 NOTE — Progress Notes (Signed)
Patient ID: Joan Nash, female   DOB: 04-Mar-1946, 74 y.o.   MRN: [id number]  PROGRESS NOTE    Joan Nash  XBJ:[id number] DOB: May 23, 1946 DOA: 10/11/2019 PCP: Arnette Felts, FNP   Brief Narrative:  74 year old female with history of hypertension, schizophrenia, depression, dementia who is a group home resident presented with fall and was found to have left hip fracture.  Orthopedics was consulted.  COVID-19 tested positive.  Assessment & Plan:   Closed left femoral neck fracture secondary to mechanical fall -Fall precautions.  Pain management.   -Status post surgical intervention on 10/12/2019 by orthopedics.  Wound care as per orthopedics recommendations. -PT/OT recommend SNF placement.  Social worker following.  Asymptomatic COVID-19 infection -Tested positive for COVID-19.  No evidence of hypoxia.  Chest x-ray with no signs of infiltrate.   -Will monitor inflammatory markers: Improving COVID-19 Labs  Recent Labs    10/14/19 0658 10/15/19 0510 10/16/19 0405  DDIMER 3.84* 5.64* 3.75*  FERRITIN 304 389* 390*  CRP 27.7* 25.1* 19.6*    Lab Results  Component Value Date   SARSCOV2NAA POSITIVE (A) 10/11/2019   D-dimer improving.  Repeat chest x-ray on 10/13/2019 was negative for infiltrate. currently not on steroids or remdesivir.  Schizophrenia -Stable.  Continue outpatient regimen  Dementia -Monitor mental status.  Fall precautions  Hypertension -Blood pressure stable.  Continue lisinopril and hydrochlorothiazide  Thrombocytopenia -Questionable cause.  Monitor  Stage II sacral pressure injury: Present on admission -Continue local wound care.   DVT prophylaxis: Heparin Code Status: Full Family Communication: None at bedside Disposition Plan: SNF once bed is available.  Patient is medically stable for SNF discharge  Consultants: Orthopedic surgery  Procedures: Left hip hemiarthroplasty on 10/12/2019  Antimicrobials: Perioperative  Subjective: Patient seen  and examined at bedside.  Poor historian.  No overnight fever, agitation, vomiting reported.  Pleasantly confused. Objective: Vitals:   10/14/19 2315 10/15/19 0909 10/15/19 1703 10/15/19 2131  BP: 127/72 135/75 131/72 131/89  Pulse: 97 92 93 96  Resp: 14 19 20 18   Temp: 98.4 F (36.9 C) 98.5 F (36.9 C) 98.1 F (36.7 C) 98.3 F (36.8 C)  TempSrc: Oral     SpO2: 90% 100% 100% 94%    Intake/Output Summary (Last 24 hours) at 10/16/2019 0734 Last data filed at 10/15/2019 2127 Gross per 24 hour  Intake --  Output 400 ml  Net -400 ml   There were no vitals filed for this visit.  Examination:  General exam: No acute distress.  Confused.   Respiratory system: Bilateral decreased breath sounds at bases; no wheezing cardiovascular system: Rate controlled, S1-S2 heard Gastrointestinal system: Abdomen is nondistended, soft and nontender.  Bowel sounds are heard.   Extremities: No cyanosis, edema   Data Reviewed: I have personally reviewed following labs and imaging studies  CBC: Recent Labs  Lab 10/11/19 1430 10/12/19 0324 10/12/19 1909 10/13/19 1117 10/14/19 0658 10/15/19 0510 10/16/19 0405  WBC 5.1   < > 8.0 8.7 8.6 7.6 6.3  NEUTROABS 3.5  --   --   --   --   --  3.7  HGB 13.5   < > 13.7 14.2 13.2 12.6 12.1  HCT 42.5   < > 43.6 45.0 40.4 38.7 37.4  MCV 93.6   < > 92.6 92.8 90.6 91.3 89.7  PLT 152   < > 163 205 186 251 301   < > = values in this interval not displayed.   Basic Metabolic Panel: Recent Labs  Lab 10/12/19 0324  10/12/19 0324 10/12/19 1909 10/13/19 0417 10/14/19 0658 10/15/19 0510 10/16/19 0405  NA 146*  --   --  140 142 141 138  K 3.4*  --   --  5.1 3.5 3.3* 3.9  CL 108  --   --  102 102 102 99  CO2 29  --   --  25 29 29 29   GLUCOSE 102*  --   --  108* 107* 111* 103*  BUN 21  --   --  22 21 21 14   CREATININE 0.60   < > 0.64 0.64 0.65 0.52 0.46  CALCIUM 9.4  --   --  9.0 9.4 9.3 9.4  MG 1.9  --   --  2.1 2.0 1.9 1.7  PHOS 4.0  --   --   --   --    --   --    < > = values in this interval not displayed.   GFR: CrCl cannot be calculated (Unknown ideal weight.). Liver Function Tests: Recent Labs  Lab 10/12/19 0324 10/13/19 0417 10/14/19 0658 10/15/19 0510 10/16/19 0405  AST 27 40 21 23 28   ALT 26 23 23 30  39  ALKPHOS 35* 40 44 42 45  BILITOT 0.7 1.8* 0.3 0.7 0.7  PROT 6.7 6.5 6.3* 6.3* 5.9*  ALBUMIN 2.6* 2.5* 2.2* 2.1* 1.9*   No results for input(s): LIPASE, AMYLASE in the last 168 hours. No results for input(s): AMMONIA in the last 168 hours. Coagulation Profile: Recent Labs  Lab 10/11/19 1430  INR 1.1   Cardiac Enzymes: No results for input(s): CKTOTAL, CKMB, CKMBINDEX, TROPONINI in the last 168 hours. BNP (last 3 results) No results for input(s): PROBNP in the last 8760 hours. HbA1C: No results for input(s): HGBA1C in the last 72 hours. CBG: Recent Labs  Lab 10/12/19 1715  GLUCAP 117*   Lipid Profile: No results for input(s): CHOL, HDL, LDLCALC, TRIG, CHOLHDL, LDLDIRECT in the last 72 hours. Thyroid Function Tests: No results for input(s): TSH, T4TOTAL, FREET4, T3FREE, THYROIDAB in the last 72 hours. Anemia Panel: Recent Labs    10/15/19 0510 10/16/19 0405  FERRITIN 389* 390*   Sepsis Labs: No results for input(s): PROCALCITON, LATICACIDVEN in the last 168 hours.  Recent Results (from the past 240 hour(s))  Respiratory Panel by RT PCR (Flu A&B, Covid) - Nasopharyngeal Swab     Status: Abnormal   Collection Time: 10/11/19  3:37 PM   Specimen: Nasopharyngeal Swab  Result Value Ref Range Status   SARS Coronavirus 2 by RT PCR POSITIVE (A) NEGATIVE Final    Comment: RESULT CALLED TO, READ BACK BY AND VERIFIED WITH: 10/14/19 RN 16:55 10/11/19 ([email or web address]) (NOTE) SARS-CoV-2 target nucleic acids are DETECTED. SARS-CoV-2 RNA is generally detectable in upper respiratory specimens  during the acute phase of infection. Positive results are indicative of the presence of the identified virus, but do not rule  out bacterial infection or co-infection with other pathogens not detected by the test. Clinical correlation with patient history and other diagnostic information is necessary to determine patient infection status. The expected result is Negative. Fact Sheet for Patients:  12/14/19 Fact Sheet for Healthcare Providers: 10/13/19 This test is not yet approved or cleared by the [place] FDA and  has been authorized for detection and/or diagnosis of SARS-CoV-2 by FDA under an Emergency Use Authorization (EUA).  This EUA will remain in effect (meaning this test can be used)  for the duration of  the COVID-19 declaration under Section 564(b)(1)  of the Act, 21 U.S.C. section 360bbb-3(b)(1), unless the authorization is terminated or revoked sooner.    Influenza A by PCR NEGATIVE NEGATIVE Final   Influenza B by PCR NEGATIVE NEGATIVE Final    Comment: (NOTE) The Xpert Xpress SARS-CoV-2/FLU/RSV assay is intended as an aid in  the diagnosis of influenza from Nasopharyngeal swab specimens and  should not be used as a sole basis for treatment. Nasal washings and  aspirates are unacceptable for Xpert Xpress SARS-CoV-2/FLU/RSV  testing. Fact Sheet for Patients: PinkCheek.be Fact Sheet for Healthcare Providers: GravelBags.it This test is not yet approved or cleared by the [place] FDA and  has been authorized for detection and/or diagnosis of SARS-CoV-2 by  FDA under an Emergency Use Authorization (EUA). This EUA will remain  in effect (meaning this test can be used) for the duration of the  Covid-19 declaration under Section 564(b)(1) of the Act, 21  U.S.C. section 360bbb-3(b)(1), unless the authorization is  terminated or revoked. Performed at [facility], [place] [address], [place], [place] [address]   Surgical PCR screen     Status: None   Collection Time:  10/11/19  9:35 PM   Specimen: Nasal Mucosa; Nasal Swab  Result Value Ref Range Status   MRSA, PCR NEGATIVE NEGATIVE Final   Staphylococcus aureus NEGATIVE NEGATIVE Final    Comment: (NOTE) The Xpert SA Assay (FDA approved for NASAL specimens in patients 63 years of age and older), is one component of a comprehensive surveillance program. It is not intended to diagnose infection nor to guide or monitor treatment. Performed at [facility], [place] [address], [place], [place] [address]          Radiology Studies: No results found.      Scheduled Meds: . vitamin C  500 mg Oral Daily  . benztropine  1 mg Oral Daily  . divalproex  250 mg Oral q morning - 10a   And  . divalproex  500 mg Oral QHS  . docusate sodium  100 mg Oral BID  . enoxaparin (LOVENOX) injection  40 mg Subcutaneous Q24H  . FLUoxetine  20 mg Oral Daily  . gabapentin  300 mg Oral TID  . hydrochlorothiazide  12.5 mg Oral Daily  . lisinopril  10 mg Oral Daily  . mupirocin ointment  1 application Nasal BID  . pravastatin  20 mg Oral Daily  . zinc sulfate  220 mg Oral Daily   Continuous Infusions: . methocarbamol (ROBAXIN) IV            Aline August, MD [facility] 10/16/2019, 7:34 AM

## 2019-10-17 LAB — BASIC METABOLIC PANEL
Anion gap: 8 (ref 5–15)
BUN: 16 mg/dL (ref 8–23)
CO2: 28 mmol/L (ref 22–32)
Calcium: 9 mg/dL (ref 8.9–10.3)
Chloride: 100 mmol/L (ref 98–111)
Creatinine, Ser: 0.62 mg/dL (ref 0.44–1.00)
GFR calc Af Amer: >60 mL/min (ref >60)
GFR calc non Af Amer: >60 mL/min (ref >60)
Glucose, Bld: 112 mg/dL — ABNORMAL HIGH (ref 70–99)
Potassium: 4.1 mmol/L (ref 3.5–5.1)
Sodium: 136 mmol/L (ref 135–145)

## 2019-10-17 LAB — CBC WITH DIFFERENTIAL/PLATELET
Abs Immature Granulocytes: 0.31 10*3/uL — ABNORMAL HIGH (ref 0.00–0.07)
Basophils Absolute: 0 10*3/uL (ref 0.0–0.1)
Basophils Relative: 1 %
Eosinophils Absolute: 0.2 10*3/uL (ref 0.0–0.5)
Eosinophils Relative: 3 %
HCT: 35 % — ABNORMAL LOW (ref 36.0–46.0)
Hemoglobin: 11.6 g/dL — ABNORMAL LOW (ref 12.0–15.0)
Immature Granulocytes: 4 %
Lymphocytes Relative: 23 %
Lymphs Abs: 1.7 10*3/uL (ref 0.7–4.0)
MCH: 29.8 pg (ref 26.0–34.0)
MCHC: 33.1 g/dL (ref 30.0–36.0)
MCV: 90 fL (ref 80.0–100.0)
Monocytes Absolute: 1.5 10*3/uL — ABNORMAL HIGH (ref 0.1–1.0)
Monocytes Relative: 20 %
Neutro Abs: 3.8 10*3/uL (ref 1.7–7.7)
Neutrophils Relative %: 49 %
Platelets: 277 10*3/uL (ref 150–400)
RBC: 3.89 MIL/uL (ref 3.87–5.11)
RDW: 14.6 % (ref 11.5–15.5)
WBC: 7.6 10*3/uL (ref 4.0–10.5)
nRBC: 0 % (ref 0.0–0.2)

## 2019-10-17 LAB — MAGNESIUM: Magnesium: 1.8 mg/dL (ref 1.7–2.4)

## 2019-10-17 LAB — D-DIMER, QUANTITATIVE: D-Dimer, Quant: 3.53 ug/mL-FEU — ABNORMAL HIGH (ref 0.00–0.50)

## 2019-10-17 LAB — FERRITIN: Ferritin: 327 ng/mL — ABNORMAL HIGH (ref 11–307)

## 2019-10-17 LAB — C-REACTIVE PROTEIN: CRP: 14.7 mg/dL — ABNORMAL HIGH (ref <1.0)

## 2019-10-17 NOTE — Progress Notes (Signed)
Patient ID: Joan Nash, female   DOB: [birth year], 74 y.o.   MRN: [id number]  PROGRESS NOTE    Guiliana Shor  DPO:[id number] DOB: 03/06/1946 DOA: 10/11/2019 PCP: Minette Brine, FNP   Brief Narrative:  74 year old female with history of hypertension, schizophrenia, depression, dementia who is a group home resident presented with fall and was found to have left hip fracture.  Orthopedics was consulted.  COVID-19 tested positive.  Assessment & Plan:   Closed left femoral neck fracture secondary to mechanical fall -Fall precautions.  Pain management.   -Status post surgical intervention on 10/12/2019 by orthopedics.  Wound care as per orthopedics recommendations. -PT/OT recommend SNF placement.  Social worker following.  Asymptomatic COVID-19 infection -Tested positive for COVID-19.  No evidence of hypoxia.  Chest x-ray with no signs of infiltrate.   -Will monitor inflammatory markers: Improving COVID-19 Labs  Recent Labs    10/15/19 0510 10/16/19 0405 10/17/19 0328  DDIMER 5.64* 3.75* 3.53*  FERRITIN 389* 390* 327*  CRP 25.1* 19.6* 14.7*    Lab Results  Component Value Date   SARSCOV2NAA POSITIVE (A) 10/11/2019   D-dimer improving.  Repeat chest x-ray on 10/13/2019 was negative for infiltrate. currently not on steroids or remdesivir. -Currently afebrile and still on room air.  Schizophrenia -Stable.  Continue outpatient regimen  Dementia -Monitor mental status.  Fall precautions  Hypertension -Blood pressure on the lower side.  Continue lisinopril.  Hold hydrochlorothiazide  Thrombocytopenia -Questionable cause.  Resolved.  Stage II sacral pressure injury: Present on admission -Continue local wound care.   DVT prophylaxis: Heparin Code Status: Full Family Communication: None at bedside Disposition Plan: SNF once bed is available.  Patient is medically stable for SNF discharge  Consultants: Orthopedic surgery  Procedures: Left hip hemiarthroplasty on 10/12/2019  Antimicrobials: Perioperative  Subjective: Patient seen and examined at bedside.  Poor historian.  No overnight fever or vomiting reported.   Objective: Vitals:   10/16/19 0816 10/16/19 1606 10/16/19 2034 10/17/19 0729  BP: 134/72 135/66 139/74 95/66  Pulse: 89 86 (!) 105 89  Resp:   20 16  Temp: 97.6 F (36.4 C) 99.2 F (37.3 C) 98 F (36.7 C) 98.8 F (37.1 C)  TempSrc: Oral Oral Oral   SpO2: 95% 94% 96% 98%    Intake/Output Summary (Last 24 hours) at 10/17/2019 0734 Last data filed at 10/17/2019 0011 Gross per 24 hour  Intake -  Output 650 ml  Net -650 ml   There were no vitals filed for this visit.  Examination:  General exam: No distress.  Pleasantly confused.   Respiratory system: Bilateral decreased breath sounds at bases with some scattered crackles.   Cardiovascular system: Mild intermittent tachycardia, S1-S2 heard Gastrointestinal system: Abdomen is nondistended, soft and nontender.  Bowel sounds are heard.   Extremities: No cyanosis, edema   Data Reviewed: I have personally reviewed following labs and imaging studies  CBC: Recent Labs  Lab 10/11/19 1430 10/12/19 0324 10/13/19 1117 10/14/19 0658 10/15/19 0510 10/16/19 0405 10/17/19 0328  WBC 5.1   < > 8.7 8.6 7.6 6.3 7.6  NEUTROABS 3.5  --   --   --   --  3.7 3.8  HGB 13.5   < > 14.2 13.2 12.6 12.1 11.6*  HCT 42.5   < > 45.0 40.4 38.7 37.4 35.0*  MCV 93.6   < > 92.8 90.6 91.3 89.7 90.0  PLT 152   < > 205 186 251 301 277   < > = values in this interval  not displayed.   Basic Metabolic Panel: Recent Labs  Lab 10/12/19 0324 10/12/19 1909 10/13/19 0417 10/14/19 0658 10/15/19 0510 10/16/19 0405 10/17/19 0328  NA 146*  --  140 142 141 138 136  K 3.4*  --  5.1 3.5 3.3* 3.9 4.1  CL 108  --  102 102 102 99 100  CO2 29  --  25 29 29 29 28   GLUCOSE 102*  --  108* 107* 111* 103* 112*  BUN 21  --  22 21 21 14 16   CREATININE 0.60   < > 0.64 0.65 0.52 0.46 0.62  CALCIUM 9.4  --  9.0 9.4 9.3 9.4 9.0   MG 1.9  --  2.1 2.0 1.9 1.7 1.8  PHOS 4.0  --   --   --   --   --   --    < > = values in this interval not displayed.   GFR: CrCl cannot be calculated (Unknown ideal weight.). Liver Function Tests: Recent Labs  Lab 10/12/19 0324 10/13/19 0417 10/14/19 0658 10/15/19 0510 10/16/19 0405  AST 27 40 21 23 28   ALT 26 23 23 30  39  ALKPHOS 35* 40 44 42 45  BILITOT 0.7 1.8* 0.3 0.7 0.7  PROT 6.7 6.5 6.3* 6.3* 5.9*  ALBUMIN 2.6* 2.5* 2.2* 2.1* 1.9*   No results for input(s): LIPASE, AMYLASE in the last 168 hours. No results for input(s): AMMONIA in the last 168 hours. Coagulation Profile: Recent Labs  Lab 10/11/19 1430  INR 1.1   Cardiac Enzymes: No results for input(s): CKTOTAL, CKMB, CKMBINDEX, TROPONINI in the last 168 hours. BNP (last 3 results) No results for input(s): PROBNP in the last 8760 hours. HbA1C: No results for input(s): HGBA1C in the last 72 hours. CBG: Recent Labs  Lab 10/12/19 1715  GLUCAP 117*   Lipid Profile: No results for input(s): CHOL, HDL, LDLCALC, TRIG, CHOLHDL, LDLDIRECT in the last 72 hours. Thyroid Function Tests: No results for input(s): TSH, T4TOTAL, FREET4, T3FREE, THYROIDAB in the last 72 hours. Anemia Panel: Recent Labs    10/16/19 0405 10/17/19 0328  FERRITIN 390* 327*   Sepsis Labs: No results for input(s): PROCALCITON, LATICACIDVEN in the last 168 hours.  Recent Results (from the past 240 hour(s))  Respiratory Panel by RT PCR (Flu A&B, Covid) - Nasopharyngeal Swab     Status: Abnormal   Collection Time: 10/11/19  3:37 PM   Specimen: Nasopharyngeal Swab  Result Value Ref Range Status   SARS Coronavirus 2 by RT PCR POSITIVE (A) NEGATIVE Final    Comment: RESULT CALLED TO, READ BACK BY AND VERIFIED WITH: 10/14/19 RN 16:55 10/11/19 ([email or web address]) (NOTE) SARS-CoV-2 target nucleic acids are DETECTED. SARS-CoV-2 RNA is generally detectable in upper respiratory specimens  during the acute phase of infection. Positive results are  indicative of the presence of the identified virus, but do not rule out bacterial infection or co-infection with other pathogens not detected by the test. Clinical correlation with patient history and other diagnostic information is necessary to determine patient infection status. The expected result is Negative. Fact Sheet for Patients:  12/15/19 Fact Sheet for Healthcare Providers: 10/13/19 This test is not yet approved or cleared by the [place] FDA and  has been authorized for detection and/or diagnosis of SARS-CoV-2 by FDA under an Emergency Use Authorization (EUA).  This EUA will remain in effect (meaning this test can be used)  for the duration of  the COVID-19 declaration under Section 564(b)(1) of the Act,  21 U.S.C. section 360bbb-3(b)(1), unless the authorization is terminated or revoked sooner.    Influenza A by PCR NEGATIVE NEGATIVE Final   Influenza B by PCR NEGATIVE NEGATIVE Final    Comment: (NOTE) The Xpert Xpress SARS-CoV-2/FLU/RSV assay is intended as an aid in  the diagnosis of influenza from Nasopharyngeal swab specimens and  should not be used as a sole basis for treatment. Nasal washings and  aspirates are unacceptable for Xpert Xpress SARS-CoV-2/FLU/RSV  testing. Fact Sheet for Patients: https://www.moore.com/ Fact Sheet for Healthcare Providers: https://www.young.biz/ This test is not yet approved or cleared by the [place] FDA and  has been authorized for detection and/or diagnosis of SARS-CoV-2 by  FDA under an Emergency Use Authorization (EUA). This EUA will remain  in effect (meaning this test can be used) for the duration of the  Covid-19 declaration under Section 564(b)(1) of the Act, 21  U.S.C. section 360bbb-3(b)(1), unless the authorization is  terminated or revoked. Performed at [facility], [address], [place], [place]  [address]   Surgical PCR screen     Status: None   Collection Time: 10/11/19  9:35 PM   Specimen: Nasal Mucosa; Nasal Swab  Result Value Ref Range Status   MRSA, PCR NEGATIVE NEGATIVE Final   Staphylococcus aureus NEGATIVE NEGATIVE Final    Comment: (NOTE) The Xpert SA Assay (FDA approved for NASAL specimens in patients 8 years of age and older), is one component of a comprehensive surveillance program. It is not intended to diagnose infection nor to guide or monitor treatment. Performed at [facility], [address], [place], [place] [address]          Radiology Studies: No results found.      Scheduled Meds: . vitamin C  500 mg Oral Daily  . benztropine  1 mg Oral Daily  . divalproex  250 mg Oral q morning - 10a   And  . divalproex  500 mg Oral QHS  . docusate sodium  100 mg Oral BID  . enoxaparin (LOVENOX) injection  40 mg Subcutaneous Q24H  . FLUoxetine  20 mg Oral Daily  . gabapentin  300 mg Oral TID  . hydrochlorothiazide  12.5 mg Oral Daily  . lisinopril  10 mg Oral Daily  . pravastatin  20 mg Oral Daily  . zinc sulfate  220 mg Oral Daily   Continuous Infusions: . methocarbamol (ROBAXIN) IV            Glade Lloyd, MD [facility] 10/17/2019, 7:34 AM

## 2019-10-17 NOTE — TOC Progression Note (Signed)
Transition of Care [facility]) - Progression Note    Patient Details  Name: Joan Nash MRN: [id number] Date of Birth: 22-Apr-1946  Transition of Care [facility]) CM/SW Contact  Mearl Latin, LCSW Phone Number: 10/17/2019, 8:27 AM  Clinical Narrative:    Pasrr received. No SNF bed offers at this time.    Expected Discharge Plan: Skilled Nursing Facility    Expected Discharge Plan and Services Expected Discharge Plan: Skilled Nursing Facility       Living arrangements for the past 2 months: Group Home                                       Social Determinants of Health (SDOH) Interventions    Readmission Risk Interventions No flowsheet data found.

## 2019-10-18 ENCOUNTER — Encounter: Payer: Self-pay | Admitting: *Deleted

## 2019-10-18 LAB — C-REACTIVE PROTEIN: CRP: 10.5 mg/dL — ABNORMAL HIGH (ref <1.0)

## 2019-10-18 LAB — FERRITIN: Ferritin: 361 ng/mL — ABNORMAL HIGH (ref 11–307)

## 2019-10-18 LAB — D-DIMER, QUANTITATIVE: D-Dimer, Quant: 3.69 ug/mL-FEU — ABNORMAL HIGH (ref 0.00–0.50)

## 2019-10-18 MED ORDER — ENOXAPARIN SODIUM 40 MG/0.4ML ~~LOC~~ SOLN
40.0000 mg | Freq: Two times a day (BID) | SUBCUTANEOUS | Status: DC
  Administered 2019-10-18 – 2019-10-19 (×3): 40 mg via SUBCUTANEOUS
  Filled 2019-10-18 (×3): qty 0.4

## 2019-10-18 NOTE — Progress Notes (Signed)
Patient ID: Joan Nash, female   DOB: 02-Dec-1945, 74 y.o.   MRN: [id number]  PROGRESS NOTE    Kenijah Benningfield  YTK:[id number] DOB: March 19, 1946 DOA: 10/11/2019 PCP: Arnette Felts, FNP   Brief Narrative:  74 year old female with history of hypertension, schizophrenia, depression, dementia who is a group home resident presented with fall and was found to have left hip fracture.  Orthopedics was consulted.  COVID-19 tested positive.  Subjective:  Patient in bed, appears comfortable, denies any headache, no fever, no chest pain or pressure, no shortness of breath , no abdominal pain. No focal weakness.   Assessment & Plan:   Closed left femoral neck fracture secondary to mechanical fall -  Status post surgical intervention on 10/12/2019 by orthopedics.  Wound care as per orthopedics recommendations.  Left leg weightbearing as tolerated per orthopedics, Lovenox for DVT prophylaxis dose increased on 10/18/2019 as D-dimer is rising.  Will require SNF.  Social work working.    Asymptomatic COVID-19 infection Tested positive for COVID-19.  No evidence of hypoxia.  Chest x-ray with no signs of infiltrate.  Will monitor inflammatory markers: Improving. COVID-19 Labs  Recent Labs    10/16/19 0405 10/17/19 0328 10/18/19 0425  DDIMER 3.75* 3.53* 3.69*  FERRITIN 390* 327* 361*  CRP 19.6* 14.7* 10.5*    Lab Results  Component Value Date   SARSCOV2NAA POSITIVE (A) 10/11/2019   Rising D-dimer likely due to inflammation from COVID-19 asymptomatic infection.  Increase Lovenox on 10/18/2019 will monitor closely.  Schizophrenia -Stable.  Continue outpatient regimen  Dementia -Monitor mental status.  Fall precautions  Hypertension -Blood pressure on the lower side.  Continue lisinopril.  Hold hydrochlorothiazide  Thrombocytopenia -Questionable cause.  Resolved.  Stage II sacral pressure injury: Present on admission -Continue local wound care.     DVT prophylaxis: Lovenox increased to twice  daily dose on 10/18/2019 as D-dimer is rising Code Status: Full Family Communication: Called and left message for the contact person on 10/18/2019 at 11:35 AM. Disposition Plan: SNF once bed is available.  Patient is medically stable for SNF discharge  Consultants: Orthopedic surgery  Procedures: Left hip hemiarthroplasty on 10/12/2019  Antimicrobials: Perioperative   Objective: Vitals:   10/17/19 1030 10/17/19 1550 10/17/19 2233 10/18/19 0952  BP: 133/67 114/80 (!) 122/58 123/67  Pulse:   81 96  Resp:   18 18  Temp:  98.3 F (36.8 C) 98.1 F (36.7 C) 98 F (36.7 C)  TempSrc:  Oral Oral Axillary  SpO2:  97% 96%     Intake/Output Summary (Last 24 hours) at 10/18/2019 1141 Last data filed at 10/18/2019 0020 Gross per 24 hour  Intake -  Output 2100 ml  Net -2100 ml   There were no vitals filed for this visit.  Examination:  Awake, comfortable, mildly confused, able to follow basic commands and answer questions, no focal deficits, however moves left leg less than the other extremities likely due to recent fracture and repair [place].AT,PERRAL Supple Neck,No JVD, No cervical lymphadenopathy appriciated.  Symmetrical Chest wall movement, Good air movement bilaterally, CTAB RRR,No Gallops, Rubs or new Murmurs, No Parasternal Heave +ve B.Sounds, Abd Soft, No tenderness, No organomegaly appriciated, No rebound - guarding or rigidity. No Cyanosis, Clubbing or edema,    Data Reviewed: I have personally reviewed following labs and imaging studies  CBC: Recent Labs  Lab 10/11/19 1430 10/12/19 0324 10/13/19 1117 10/14/19 0658 10/15/19 0510 10/16/19 0405 10/17/19 0328  WBC 5.1   < > 8.7 8.6 7.6 6.3 7.6  NEUTROABS  3.5  --   --   --   --  3.7 3.8  HGB 13.5   < > 14.2 13.2 12.6 12.1 11.6*  HCT 42.5   < > 45.0 40.4 38.7 37.4 35.0*  MCV 93.6   < > 92.8 90.6 91.3 89.7 90.0  PLT 152   < > 205 186 251 301 277   < > = values in this interval not displayed.   Basic Metabolic Panel: Recent  Labs  Lab 10/12/19 0324 10/12/19 1909 10/13/19 0417 10/14/19 0658 10/15/19 0510 10/16/19 0405 10/17/19 0328  NA 146*  --  140 142 141 138 136  K 3.4*  --  5.1 3.5 3.3* 3.9 4.1  CL 108  --  102 102 102 99 100  CO2 29  --  25 29 29 29 28   GLUCOSE 102*  --  108* 107* 111* 103* 112*  BUN 21  --  22 21 21 14 16   CREATININE 0.60   < > 0.64 0.65 0.52 0.46 0.62  CALCIUM 9.4  --  9.0 9.4 9.3 9.4 9.0  MG 1.9  --  2.1 2.0 1.9 1.7 1.8  PHOS 4.0  --   --   --   --   --   --    < > = values in this interval not displayed.   GFR: CrCl cannot be calculated (Unknown ideal weight.). Liver Function Tests: Recent Labs  Lab 10/12/19 0324 10/13/19 0417 10/14/19 0658 10/15/19 0510 10/16/19 0405  AST 27 40 21 23 28   ALT 26 23 23 30  39  ALKPHOS 35* 40 44 42 45  BILITOT 0.7 1.8* 0.3 0.7 0.7  PROT 6.7 6.5 6.3* 6.3* 5.9*  ALBUMIN 2.6* 2.5* 2.2* 2.1* 1.9*   No results for input(s): LIPASE, AMYLASE in the last 168 hours. No results for input(s): AMMONIA in the last 168 hours. Coagulation Profile: Recent Labs  Lab 10/11/19 1430  INR 1.1   Cardiac Enzymes: No results for input(s): CKTOTAL, CKMB, CKMBINDEX, TROPONINI in the last 168 hours. BNP (last 3 results) No results for input(s): PROBNP in the last 8760 hours. HbA1C: No results for input(s): HGBA1C in the last 72 hours. CBG: Recent Labs  Lab 10/12/19 1715  GLUCAP 117*   Lipid Profile: No results for input(s): CHOL, HDL, LDLCALC, TRIG, CHOLHDL, LDLDIRECT in the last 72 hours. Thyroid Function Tests: No results for input(s): TSH, T4TOTAL, FREET4, T3FREE, THYROIDAB in the last 72 hours. Anemia Panel: Recent Labs    10/17/19 0328 10/18/19 0425  FERRITIN 327* 361*   Sepsis Labs: No results for input(s): PROCALCITON, LATICACIDVEN in the last 168 hours.  Recent Results (from the past 240 hour(s))  Respiratory Panel by RT PCR (Flu A&B, Covid) - Nasopharyngeal Swab     Status: Abnormal   Collection Time: 10/11/19  3:37 PM    Specimen: Nasopharyngeal Swab  Result Value Ref Range Status   SARS Coronavirus 2 by RT PCR POSITIVE (A) NEGATIVE Final    Comment: RESULT CALLED TO, READ BACK BY AND VERIFIED WITH: 10/14/19 RN 16:55 10/11/19 ([email or web address]) (NOTE) SARS-CoV-2 target nucleic acids are DETECTED. SARS-CoV-2 RNA is generally detectable in upper respiratory specimens  during the acute phase of infection. Positive results are indicative of the presence of the identified virus, but do not rule out bacterial infection or co-infection with other pathogens not detected by the test. Clinical correlation with patient history and other diagnostic information is necessary to determine patient infection status. The expected result is Negative.  Fact Sheet for Patients:  PinkCheek.be Fact Sheet for Healthcare Providers: GravelBags.it This test is not yet approved or cleared by the [place] FDA and  has been authorized for detection and/or diagnosis of SARS-CoV-2 by FDA under an Emergency Use Authorization (EUA).  This EUA will remain in effect (meaning this test can be used)  for the duration of  the COVID-19 declaration under Section 564(b)(1) of the Act, 21 U.S.C. section 360bbb-3(b)(1), unless the authorization is terminated or revoked sooner.    Influenza A by PCR NEGATIVE NEGATIVE Final   Influenza B by PCR NEGATIVE NEGATIVE Final    Comment: (NOTE) The Xpert Xpress SARS-CoV-2/FLU/RSV assay is intended as an aid in  the diagnosis of influenza from Nasopharyngeal swab specimens and  should not be used as a sole basis for treatment. Nasal washings and  aspirates are unacceptable for Xpert Xpress SARS-CoV-2/FLU/RSV  testing. Fact Sheet for Patients: PinkCheek.be Fact Sheet for Healthcare Providers: GravelBags.it This test is not yet approved or cleared by the [place] FDA and  has been  authorized for detection and/or diagnosis of SARS-CoV-2 by  FDA under an Emergency Use Authorization (EUA). This EUA will remain  in effect (meaning this test can be used) for the duration of the  Covid-19 declaration under Section 564(b)(1) of the Act, 21  U.S.C. section 360bbb-3(b)(1), unless the authorization is  terminated or revoked. Performed at [facility], [place] [address], [place], [place] [address]   Surgical PCR screen     Status: None   Collection Time: 10/11/19  9:35 PM   Specimen: Nasal Mucosa; Nasal Swab  Result Value Ref Range Status   MRSA, PCR NEGATIVE NEGATIVE Final   Staphylococcus aureus NEGATIVE NEGATIVE Final    Comment: (NOTE) The Xpert SA Assay (FDA approved for NASAL specimens in patients 63 years of age and older), is one component of a comprehensive surveillance program. It is not intended to diagnose infection nor to guide or monitor treatment. Performed at [facility], [place] [address] [place], [place] [address]      Radiology Studies: No results found.  Scheduled Meds: . vitamin C  500 mg Oral Daily  . benztropine  1 mg Oral Daily  . divalproex  250 mg Oral q morning - 10a   And  . divalproex  500 mg Oral QHS  . docusate sodium  100 mg Oral BID  . enoxaparin (LOVENOX) injection  40 mg Subcutaneous Q12H  . FLUoxetine  20 mg Oral Daily  . gabapentin  300 mg Oral TID  . lisinopril  10 mg Oral Daily  . pravastatin  20 mg Oral Daily  . zinc sulfate  220 mg Oral Daily   Continuous Infusions:  Lala Lund, MD [facility] 10/18/2019, 11:41 AM

## 2019-10-18 NOTE — TOC Progression Note (Signed)
Transition of Care [facility]) - Progression Note    Patient Details  Name: Joan Nash MRN: [id number] Date of Birth: 25-Aug-1946  [facility]) CM/SW Contact  Ranelle Oyster, Student-Social Work Phone Number: 10/18/2019, 10:21 AM  Clinical Narrative:    MSW updated patient's guardian of bed offers. MSW stated that [facility] in [place] has accepted pt and is likely to have a bed open for tomorrow. Jerline Pain approved the offer accepted and the discharge planning needs for the pt. All questions were answered and MSW and supervisor will continue to follow up with d/c needs.    Expected Discharge Plan: Skilled Nursing Facility    Expected Discharge Plan and Services Expected Discharge Plan: Skilled Nursing Facility       Living arrangements for the past 2 months: Group Home                                       Social Determinants of Health (SDOH) Interventions    Readmission Risk Interventions No flowsheet data found.

## 2019-10-19 LAB — COMPREHENSIVE METABOLIC PANEL
ALT: 63 U/L — ABNORMAL HIGH (ref 0–44)
AST: 48 U/L — ABNORMAL HIGH (ref 15–41)
Albumin: 2.3 g/dL — ABNORMAL LOW (ref 3.5–5.0)
Alkaline Phosphatase: 48 U/L (ref 38–126)
Anion gap: 9 (ref 5–15)
BUN: 13 mg/dL (ref 8–23)
CO2: 27 mmol/L (ref 22–32)
Calcium: 9.6 mg/dL (ref 8.9–10.3)
Chloride: 103 mmol/L (ref 98–111)
Creatinine, Ser: 0.57 mg/dL (ref 0.44–1.00)
GFR calc Af Amer: >60 mL/min (ref >60)
GFR calc non Af Amer: >60 mL/min (ref >60)
Glucose, Bld: 108 mg/dL — ABNORMAL HIGH (ref 70–99)
Potassium: 4.4 mmol/L (ref 3.5–5.1)
Sodium: 139 mmol/L (ref 135–145)
Total Bilirubin: 0.6 mg/dL (ref 0.3–1.2)
Total Protein: 7 g/dL (ref 6.5–8.1)

## 2019-10-19 LAB — C-REACTIVE PROTEIN: CRP: 7.2 mg/dL — ABNORMAL HIGH (ref <1.0)

## 2019-10-19 LAB — CBC WITH DIFFERENTIAL/PLATELET
Abs Immature Granulocytes: 0.2 10*3/uL — ABNORMAL HIGH (ref 0.00–0.07)
Basophils Absolute: 0 10*3/uL (ref 0.0–0.1)
Basophils Relative: 0 %
Eosinophils Absolute: 0.2 10*3/uL (ref 0.0–0.5)
Eosinophils Relative: 2 %
HCT: 38.8 % (ref 36.0–46.0)
Hemoglobin: 12.6 g/dL (ref 12.0–15.0)
Lymphocytes Relative: 17 %
Lymphs Abs: 1.4 10*3/uL (ref 0.7–4.0)
MCH: 29.6 pg (ref 26.0–34.0)
MCHC: 32.5 g/dL (ref 30.0–36.0)
MCV: 91.3 fL (ref 80.0–100.0)
Metamyelocytes Relative: 1 %
Monocytes Absolute: 0.8 10*3/uL (ref 0.1–1.0)
Monocytes Relative: 10 %
Myelocytes: 2 %
Neutro Abs: 5.6 10*3/uL (ref 1.7–7.7)
Neutrophils Relative %: 68 %
Platelets: 449 10*3/uL — ABNORMAL HIGH (ref 150–400)
RBC: 4.25 MIL/uL (ref 3.87–5.11)
RDW: 14.6 % (ref 11.5–15.5)
WBC: 8.3 10*3/uL (ref 4.0–10.5)
nRBC: 0 % (ref 0.0–0.2)
nRBC: 0 /100 WBC

## 2019-10-19 LAB — FERRITIN: Ferritin: 319 ng/mL — ABNORMAL HIGH (ref 11–307)

## 2019-10-19 LAB — BRAIN NATRIURETIC PEPTIDE: B Natriuretic Peptide: 37.9 pg/mL (ref 0.0–100.0)

## 2019-10-19 LAB — D-DIMER, QUANTITATIVE: D-Dimer, Quant: 3.32 ug/mL-FEU — ABNORMAL HIGH (ref 0.00–0.50)

## 2019-10-19 MED ORDER — ACETAMINOPHEN 325 MG PO TABS: 650.0000 mg | ORAL_TABLET | Freq: Four times a day (QID) | ORAL | Status: AC | PRN

## 2019-10-19 MED ORDER — APIXABAN 2.5 MG PO TABS: 2.5000 mg | ORAL_TABLET | Freq: Two times a day (BID) | ORAL | 0 refills | Status: AC

## 2019-10-19 MED ORDER — POLYETHYLENE GLYCOL 3350 17 G PO PACK: 17.0000 g | PACK | Freq: Every day | ORAL | 0 refills | Status: AC | PRN

## 2019-10-19 MED ORDER — METHYLPREDNISOLONE 4 MG PO TBPK: ORAL_TABLET | ORAL | 0 refills | Status: AC

## 2019-10-19 NOTE — Discharge Instructions (Signed)
Follow with Primary MD Minette Brine, FNP in 7 days   Get CBC, CMP, 2 view Chest X ray -  checked next visit within 1 week by Primary MD or SNF MD   Activity: As tolerated with Full fall precautions use walker/cane & assistance as needed  Disposition SNF  Diet: Heart Healthy  with feeding assistance and aspiration precautions.  Special Instructions: If you have smoked or chewed Tobacco  in the last 2 yrs please stop smoking, stop any regular Alcohol  and or any Recreational drug use.  On your next visit with your primary care physician please Get Medicines reviewed and adjusted.  Please request your Prim.MD to go over all Hospital Tests and Procedure/Radiological results at the follow up, please get all Hospital records sent to your Prim MD by signing hospital release before you go home.  If you experience worsening of your admission symptoms, develop shortness of breath, life threatening emergency, suicidal or homicidal thoughts you must seek medical attention immediately by calling 911 or calling your MD immediately  if symptoms less severe.  You Must read complete instructions/literature along with all the possible adverse reactions/side effects for all the Medicines you take and that have been prescribed to you. Take any new Medicines after you have completely understood and accpet all the possible adverse reactions/side effects.       INSTRUCTIONS AFTER JOINT REPLACEMENT   o Remove items at home which could result in a fall. This includes throw rugs or furniture in walking pathways o ICE to the affected joint every three hours while awake for 30 minutes at a time, for at least the first 3-5 days, and then as needed for pain and swelling.  Continue to use ice for pain and swelling. You may notice swelling that will progress down to the foot and ankle.  This is normal after surgery.  Elevate your leg when you are not up walking on it.   o Continue to use the breathing machine you got in  the hospital (incentive spirometer) which will help keep your temperature down.  It is common for your temperature to cycle up and down following surgery, especially at night when you are not up moving around and exerting yourself.  The breathing machine keeps your lungs expanded and your temperature down.   DIET:  As you were doing prior to hospitalization, we recommend a well-balanced diet.  DRESSING / WOUND CARE / SHOWERING  You may change your surgical dressing 7 days after surgery.  Then change the dressing every day with sterile gauze.  Please use good hand washing techniques before changing the dressing.  Do not use any lotions or creams on the incision until instructed by your surgeon.  You may shower while you have the surgical dressing which is waterproof.  After removal of surgical dressing, you must cover the incision when showering.  ACTIVITY  o Increase activity slowly as tolerated, but follow the weight bearing instructions below.   o No driving for 6 weeks or until further direction given by your physician.  You cannot drive while taking narcotics.  o No lifting or carrying greater than 10 lbs. until further directed by your surgeon. o Avoid periods of inactivity such as sitting longer than an hour when not asleep. This helps prevent blood clots.  o You may return to work once you are authorized by your doctor.     WEIGHT BEARING   Weight bearing as tolerated with assist device (walker, cane, etc) as  directed, use it as long as suggested by your surgeon or therapist, typically at least 4-6 weeks.   EXERCISES  Results after joint replacement surgery are often greatly improved when you follow the exercise, range of motion and muscle strengthening exercises prescribed by your doctor. Safety measures are also important to protect the joint from further injury. Any time any of these exercises cause you to have increased pain or swelling, decrease what you are doing until you are  comfortable again and then slowly increase them. If you have problems or questions, call your caregiver or physical therapist for advice.   Rehabilitation is important following a joint replacement. After just a few days of immobilization, the muscles of the leg can become weakened and shrink (atrophy).  These exercises are designed to build up the tone and strength of the thigh and leg muscles and to improve motion. Often times heat used for twenty to thirty minutes before working out will loosen up your tissues and help with improving the range of motion but do not use heat for the first two weeks following surgery (sometimes heat can increase post-operative swelling).   These exercises can be done on a training (exercise) mat, on the floor, on a table or on a bed. Use whatever works the best and is most comfortable for you.    Use music or television while you are exercising so that the exercises are a pleasant break in your day. This will make your life better with the exercises acting as a break in your routine that you can look forward to.   Perform all exercises about fifteen times, three times per day or as directed.  You should exercise both the operative leg and the other leg as well.  Exercises include:   . Quad Sets - Tighten up the muscle on the front of the thigh (Quad) and hold for 5-10 seconds.   . Straight Leg Raises - With your knee straight (if you were given a brace, keep it on), lift the leg to 60 degrees, hold for 3 seconds, and slowly lower the leg.  Perform this exercise against resistance later as your leg gets stronger.  . Leg Slides: Lying on your back, slowly slide your foot toward your buttocks, bending your knee up off the floor (only go as far as is comfortable). Then slowly slide your foot back down until your leg is flat on the floor again.  Lawanna Kobus Wings: Lying on your back spread your legs to the side as far apart as you can without causing discomfort.  . Hamstring  Strength:  Lying on your back, push your heel against the floor with your leg straight by tightening up the muscles of your buttocks.  Repeat, but this time bend your knee to a comfortable angle, and push your heel against the floor.  You may put a pillow under the heel to make it more comfortable if necessary.   A rehabilitation program following joint replacement surgery can speed recovery and prevent re-injury in the future due to weakened muscles. Contact your doctor or a physical therapist for more information on knee rehabilitation.    CONSTIPATION  Constipation is defined medically as fewer than three stools per week and severe constipation as less than one stool per week.  Even if you have a regular bowel pattern at home, your normal regimen is likely to be disrupted due to multiple reasons following surgery.  Combination of anesthesia, postoperative narcotics, change in appetite and fluid  intake all can affect your bowels.   YOU MUST use at least one of the following options; they are listed in order of increasing strength to get the job done.  They are all available over the counter, and you may need to use some, POSSIBLY even all of these options:    Drink plenty of fluids (prune juice may be helpful) and high fiber foods Colace 100 mg by mouth twice a day  Senokot for constipation as directed and as needed Dulcolax (bisacodyl), take with full glass of water  Miralax (polyethylene glycol) once or twice a day as needed.  If you have tried all these things and are unable to have a bowel movement in the first 3-4 days after surgery call either your surgeon or your primary doctor.    If you experience loose stools or diarrhea, hold the medications until you stool forms back up.  If your symptoms do not get better within 1 week or if they get worse, check with your doctor.  If you experience "the worst abdominal pain ever" or develop nausea or vomiting, please contact the office immediately  for further recommendations for treatment.   ITCHING:  If you experience itching with your medications, try taking only a single pain pill, or even half a pain pill at a time.  You can also use Benadryl over the counter for itching or also to help with sleep.   TED HOSE STOCKINGS:  Use stockings on both legs until for at least 2 weeks or as directed by physician office. They may be removed at night for sleeping.  MEDICATIONS:  See your medication summary on the "After Visit Summary" that nursing will review with you.  You may have some home medications which will be placed on hold until you complete the course of blood thinner medication.  It is important for you to complete the blood thinner medication as prescribed.  PRECAUTIONS:  If you experience chest pain or shortness of breath - call 911 immediately for transfer to the hospital emergency department.   If you develop a fever greater that 101 F, purulent drainage from wound, increased redness or drainage from wound, foul odor from the wound/dressing, or calf pain - CONTACT YOUR SURGEON.                                                   FOLLOW-UP APPOINTMENTS:  If you do not already have a post-op appointment, please call the office for an appointment to be seen by your surgeon.  Guidelines for how soon to be seen are listed in your "After Visit Summary", but are typically between 1-4 weeks after surgery.  OTHER INSTRUCTIONS:   Knee Replacement:  Do not place pillow under knee, focus on keeping the knee straight while resting. CPM instructions: 0-90 degrees, 2 hours in the morning, 2 hours in the afternoon, and 2 hours in the evening. Place foam block, curve side up under heel at all times except when in CPM or when walking.  DO NOT modify, tear, cut, or change the foam block in any way.  MAKE SURE YOU:  . Understand these instructions.  . Get help right away if you are not doing well or get worse.    Thank you for letting us be a part of  your medical care team.  It is  a privilege we respect greatly.  We hope these instructions will help you stay on track for a fast and full recovery!

## 2019-10-19 NOTE — Discharge Summary (Signed)
Joan Nash JZB:[id number] DOB: 1946-03-11 DOA: 10/11/2019  PCP: Minette Brine, FNP  Admit date: 10/11/2019  Discharge date: 10/19/2019  Admitted From: Home  Disposition:  Home  Recommendations for Outpatient Follow-up:   Follow up with PCP in 1-2 weeks  PCP Please obtain BMP/CBC, 2 view CXR in 1week,  (see Discharge instructions)   PCP Please follow up on the following pending results:     Home Health: None  Equipment/Devices: None  Consultations: Ortho Discharge Condition: Stable    CODE STATUS: Full    Diet Recommendation: Heart Healthy   Chief Complaint  Patient presents with  . Fall     Brief history of present illness from the day of admission and additional interim summary    74 year old female with history of hypertension, schizophrenia, depression, dementia who is a group home resident presented with fall and was found to have left hip fracture.  Orthopedics was consulted.  COVID-19 tested positive.                                                                 Hospital Course   Closed left femoral neck fracture secondary to mechanical fall -  Status post surgical intervention on 10/12/2019 by orthopedics.  Wound care as per orthopedics recommendations.  Left leg weightbearing as tolerated per orthopedics, will place her on 1 month off Eliquis at prophylactic dose due to borderline high D-dimer in the setting of inflammation from Covid.  Will require SNF.  Will be discharged there today.  Request SNF MD to check CBC, CMP, D-dimer, CRP in about 2 weeks time.  Full fall precautions.  Follow with orthopedics in 2 weeks post discharge.    Asymptomatic COVID-19 infection Tested positive for COVID-19.  No evidence of hypoxia.  Chest x-ray with no signs of infiltrate.  Will place on low-dose oral steroid  taper upon discharge.  SpO2: 100 %  Recent Labs  Lab 10/15/19 0510 10/16/19 0405 10/17/19 0328 10/18/19 0425 10/19/19 0511  CRP 25.1* 19.6* 14.7* 10.5* 7.2*  DDIMER 5.64* 3.75* 3.53* 3.69* 3.32*  FERRITIN 389* 390* 327* 361* 319*  BNP  --   --   --   --  37.9    Hepatic Function Latest Ref Rng & Units 10/19/2019 10/16/2019 10/15/2019  Total Protein 6.5 - 8.1 g/dL 7.0 5.9(L) 6.3(L)  Albumin 3.5 - 5.0 g/dL 2.3(L) 1.9(L) 2.1(L)  AST 15 - 41 U/L 48(H) 28 23  ALT 0 - 44 U/L 63(H) 39 30  Alk Phosphatase 38 - 126 U/L 48 45 42  Total Bilirubin 0.3 - 1.2 mg/dL 0.6 0.7 0.7    Rising D-dimer likely due to inflammation from COVID-19 asymptomatic infection.  Placed on prophylactic Eliquis for a month.  Schizophrenia -Stable.  Continue outpatient regimen  Dementia -Monitor mental status.  Fall  precautions  Hypertension -Blood pressure on the lower side.  Continue lisinopril.  Hold hydrochlorothiazide  Thrombocytopenia -Questionable cause.  Resolved.  Stage II sacral pressure injury: Present on admission -Continue local wound care.    Discharge diagnosis     Principal Problem:   Left displaced femoral neck fracture [facility])    Discharge instructions    Discharge Instructions    Diet - low sodium heart healthy   Complete by: As directed    Discharge instructions   Complete by: As directed    Follow with Primary MD Minette Brine, FNP in 7 days   Get CBC, CMP, 2 view Chest X ray -  checked next visit within 1 week by Primary MD or SNF MD   Activity: As tolerated with Full fall precautions use walker/cane & assistance as needed  Disposition SNF  Diet: Heart Healthy  with feeding assistance and aspiration precautions.  Special Instructions: If you have smoked or chewed Tobacco  in the last 2 yrs please stop smoking, stop any regular Alcohol  and or any Recreational drug use.  On your next visit with your primary care physician please Get Medicines reviewed and  adjusted.  Please request your Prim.MD to go over all Hospital Tests and Procedure/Radiological results at the follow up, please get all Hospital records sent to your Prim MD by signing hospital release before you go home.  If you experience worsening of your admission symptoms, develop shortness of breath, life threatening emergency, suicidal or homicidal thoughts you must seek medical attention immediately by calling 911 or calling your MD immediately  if symptoms less severe.  You Must read complete instructions/literature along with all the possible adverse reactions/side effects for all the Medicines you take and that have been prescribed to you. Take any new Medicines after you have completely understood and accpet all the possible adverse reactions/side effects.   Increase activity slowly   Complete by: As directed    Weight bearing as tolerated   Complete by: As directed       Discharge Medications   Allergies as of 10/19/2019      Reactions   Penicillins Anaphylaxis   Has patient had a PCN reaction causing immediate rash, facial/tongue/throat swelling, SOB or lightheadedness with hypotension: yes Has patient had a PCN reaction causing severe rash involving mucus membranes or skin necrosis: no Has patient had a PCN reaction that required hospitalization yes Has patient had a PCN reaction occurring within the last 10 years: no If all of the above answers are "NO", then may proceed with Cephalosporin use.      Medication List    STOP taking these medications   clonazePAM 0.5 MG tablet Commonly known as: KLONOPIN     TAKE these medications   acetaminophen 325 MG tablet Commonly known as: TYLENOL Take 2 tablets (650 mg total) by mouth every 6 (six) hours as needed for mild pain or moderate pain (pain score 1-3 or temp > 100.5).   amantadine 100 MG capsule Commonly known as: SYMMETREL Take 100 mg by mouth 2 (two) times daily.   apixaban 2.5 MG Tabs tablet Commonly known  as: Eliquis Take 1 tablet (2.5 mg total) by mouth 2 (two) times daily for 28 days.   benztropine 1 MG tablet Commonly known as: COGENTIN Take 1 mg by mouth daily.   calcium-vitamin D 500-400 MG-UNIT tablet Commonly known as: OSCAL-500 Take 1 tablet by mouth 2 (two) times daily.   divalproex 125 MG capsule Commonly known as:  DEPAKOTE SPRINKLE Take 250-500 mg by mouth See admin instructions. '250mg'$  in the morning and '500mg'$  at bedtime   docusate sodium 100 MG capsule Commonly known as: COLACE Take 100 mg by mouth daily.   FLUoxetine 20 MG capsule Commonly known as: PROZAC Take 20 mg by mouth daily.   gabapentin 100 MG capsule Commonly known as: NEURONTIN Take 100 mg by mouth 2 (two) times daily.   Generlac 10 GM/15ML Soln Generic drug: lactulose (encephalopathy) Take 20 g by mouth daily as needed (severe contstipation).   haloperidol 0.5 MG tablet Commonly known as: HALDOL Take 0.5 mg by mouth 2 (two) times a week. Mondays and Wednesdays as needed   haloperidol decanoate 100 MG/ML injection Commonly known as: HALDOL DECANOATE Inject 1 mL into the muscle every 28 (twenty-eight) days. On the 27th of each month   lisinopril-hydrochlorothiazide 10-12.5 MG tablet Commonly known as: ZESTORETIC Take 1 tablet by mouth daily.   METAMUCIL PO Take 1 Dose by mouth daily.   methylPREDNISolone 4 MG Tbpk tablet Commonly known as: MEDROL DOSEPAK follow package directions   OLANZapine 20 MG tablet Commonly known as: ZYPREXA Take 20 mg by mouth at bedtime.   polyethylene glycol 17 g packet Commonly known as: MIRALAX / GLYCOLAX Take 17 g by mouth daily as needed for mild constipation.   pravastatin 20 MG tablet Commonly known as: PRAVACHOL Take 20 mg by mouth at bedtime.            Discharge Care Instructions  (From admission, onward)         Start     Ordered   10/12/19 0000  Weight bearing as tolerated     10/12/19 1552          Follow-up Information    Leandrew Koyanagi, MD In 2 weeks.   Specialty: Orthopedic Surgery Why: For suture removal, For wound re-check Contact information: [place] [id number] [phone]        Minette Brine, [place]. Schedule an appointment as soon as possible for a visit in 1 week(s).   Specialty: General Practice Contact information: [address] [place] [address] [phone]           Major procedures and Radiology Reports - PLEASE review detailed and final reports thoroughly  -       DG Chest 1 View  Result Date: 10/11/2019 CLINICAL DATA:  Fall.  Left leg pain. EXAM: CHEST  1 VIEW COMPARISON:  04/10/2016 FINDINGS: The heart size and mediastinal contours are within normal limits. Both lungs are clear. The visualized skeletal structures are unremarkable. IMPRESSION: No active disease. Electronically Signed   By: Nelson Chimes M.D.   On: 10/11/2019 14:21   CT Head Wo Contrast  Result Date: 10/11/2019 CLINICAL DATA:  Trauma, fall from bed EXAM: CT HEAD WITHOUT CONTRAST CT CERVICAL SPINE WITHOUT CONTRAST TECHNIQUE: Multidetector CT imaging of the head and cervical spine was performed following the standard protocol without intravenous contrast. Multiplanar CT image reconstructions of the cervical spine were also generated. COMPARISON:  None. FINDINGS: CT HEAD FINDINGS Brain: No evidence of acute infarction, hemorrhage, hydrocephalus, extra-axial collection or mass lesion/mass effect. Periventricular white matter hypodensity. Vascular: No hyperdense vessel or unexpected calcification. Skull: Normal. Negative for fracture or focal lesion. Sinuses/Orbits: No acute finding. Other: None. CT CERVICAL SPINE FINDINGS Alignment: Normal. Skull base and vertebrae: No acute fracture. No primary bone lesion or focal pathologic process. 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 Soft tissues  and spinal canal: No prevertebral fluid or swelling. No visible canal hematoma.  Disc levels: Moderate disc space height loss and osteophytosis of the lower cervical spine. Upper chest: Negative. Other: None. IMPRESSION: 1. No acute intracranial pathology. Small-vessel white matter disease. 2. No fracture or static subluxation of the cervical spine. Electronically Signed   By: Eddie Candle M.D.   On: 10/11/2019 15:54   CT Cervical Spine Wo Contrast  Result Date: 10/11/2019 CLINICAL DATA:  Trauma, fall from bed EXAM: CT HEAD WITHOUT CONTRAST CT CERVICAL SPINE WITHOUT CONTRAST TECHNIQUE: Multidetector CT imaging of the head and cervical spine was performed following the standard protocol without intravenous contrast. Multiplanar CT image reconstructions of the cervical spine were also generated. COMPARISON:  None. FINDINGS: CT HEAD FINDINGS Brain: No evidence of acute infarction, hemorrhage, hydrocephalus, extra-axial collection or mass lesion/mass effect. Periventricular white matter hypodensity. Vascular: No hyperdense vessel or unexpected calcification. Skull: Normal. Negative for fracture or focal lesion. Sinuses/Orbits: No acute finding. Other: None. CT CERVICAL SPINE FINDINGS Alignment: Normal. Skull base and vertebrae: No acute fracture. No primary bone lesion or focal pathologic process. 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 Soft tissues and spinal canal: No prevertebral fluid or swelling. No visible canal hematoma. Disc levels: Moderate disc space height loss and osteophytosis of the lower cervical spine. Upper chest: Negative. Other: None. IMPRESSION: 1. No acute intracranial pathology. Small-vessel white matter disease. 2. No fracture or static subluxation of the cervical spine. Electronically Signed   By: Eddie Candle M.D.   On: 10/11/2019 15:54   Pelvis Portable  Result Date: 10/12/2019 CLINICAL DATA:  Left femoral neck fracture. Status post left hemiarthroplasty. EXAM: PORTABLE PELVIS 1-2 VIEWS COMPARISON:  Radiographs dated 10/11/2019 FINDINGS: The  patient has undergone left hemiarthroplasty. The prosthesis appears in excellent position in the AP projection. No fractures. Expected postsurgical air in the soft tissues. IMPRESSION: Satisfactory appearance of the left hip in the AP projection after hemiarthroplasty. Electronically Signed   By: Lorriane Shire M.D.   On: 10/12/2019 17:10   DG Chest Port 1 View  Result Date: 10/13/2019 CLINICAL DATA:  Worsening inflammatory markers, dyspnea, COVID-19 EXAM: PORTABLE CHEST 1 VIEW COMPARISON:  Portable exam 0816 hours compared to 10/11/2019 FINDINGS: Normal heart size, mediastinal contours, and pulmonary vascularity. Lungs clear. No pleural effusion or pneumothorax. Scattered endplate spur formation thoracic spine osseous demineralization noted. IMPRESSION: No acute abnormalities. Electronically Signed   By: Lavonia Dana M.D.   On: 10/13/2019 08:36   DG C-Arm 1-60 Min  Result Date: 10/12/2019 CLINICAL DATA:  74 year old female with hip fracture EXAM: OPERATIVE LEFT HIP (WITH PELVIS IF PERFORMED)  VIEWS TECHNIQUE: Fluoroscopic spot image(s) were submitted for interpretation post-operatively. COMPARISON:  None. FINDINGS: Intraoperative fluoroscopic spot images. Surgical changes of left hip arthroplasty. No immediate complicating features. IMPRESSION: Limited intraoperative fluoroscopic spot images of left hip arthroplasty. Please refer to the dictated operative report for full details of intraoperative findings and procedure. Electronically Signed   By: Corrie Mckusick D.O.   On: 10/12/2019 16:11   DG HIP OPERATIVE UNILAT WITH PELVIS LEFT  Result Date: 10/12/2019 CLINICAL DATA:  74 year old female with hip fracture EXAM: OPERATIVE LEFT HIP (WITH PELVIS IF PERFORMED)  VIEWS TECHNIQUE: Fluoroscopic spot image(s) were submitted for interpretation post-operatively. COMPARISON:  None. FINDINGS: Intraoperative fluoroscopic spot images. Surgical changes of left hip arthroplasty. No immediate complicating features.  IMPRESSION: Limited intraoperative fluoroscopic spot images of left hip arthroplasty. Please refer to the dictated operative report for full details of  intraoperative findings and procedure. Electronically Signed   By: Corrie Mckusick D.O.   On: 10/12/2019 16:11   DG Hip Unilat W or Wo Pelvis 2-3 Views Left  Result Date: 10/11/2019 CLINICAL DATA:  Left hip pain after fall. EXAM: DG HIP (WITH OR WITHOUT PELVIS) 2-3V LEFT COMPARISON:  None. FINDINGS: Acute displaced fracture of the left femoral neck with varus and apex anterior angulation. No additional fracture. No dislocation. The pubic symphysis and sacroiliac joints are intact. Soft tissues are unremarkable. IMPRESSION: 1. Acute displaced and angulated left femoral neck fracture. Electronically Signed   By: Titus Dubin M.D.   On: 10/11/2019 14:21    Micro Results     Recent Results (from the past 240 hour(s))  Respiratory Panel by RT PCR (Flu A&B, Covid) - Nasopharyngeal Swab     Status: Abnormal   Collection Time: 10/11/19  3:37 PM   Specimen: Nasopharyngeal Swab  Result Value Ref Range Status   SARS Coronavirus 2 by RT PCR POSITIVE (A) NEGATIVE Final    Comment: RESULT CALLED TO, READ BACK BY AND VERIFIED WITH: Lyda Kalata RN 16:55 10/11/19 ([email or web address]) (NOTE) SARS-CoV-2 target nucleic acids are DETECTED. SARS-CoV-2 RNA is generally detectable in upper respiratory specimens  during the acute phase of infection. Positive results are indicative of the presence of the identified virus, but do not rule out bacterial infection or co-infection with other pathogens not detected by the test. Clinical correlation with patient history and other diagnostic information is necessary to determine patient infection status. The expected result is Negative. Fact Sheet for Patients:  PinkCheek.be Fact Sheet for Healthcare Providers: GravelBags.it This test is not yet approved or cleared by the  [place] FDA and  has been authorized for detection and/or diagnosis of SARS-CoV-2 by FDA under an Emergency Use Authorization (EUA).  This EUA will remain in effect (meaning this test can be used)  for the duration of  the COVID-19 declaration under Section 564(b)(1) of the Act, 21 U.S.C. section 360bbb-3(b)(1), unless the authorization is terminated or revoked sooner.    Influenza A by PCR NEGATIVE NEGATIVE Final   Influenza B by PCR NEGATIVE NEGATIVE Final    Comment: (NOTE) The Xpert Xpress SARS-CoV-2/FLU/RSV assay is intended as an aid in  the diagnosis of influenza from Nasopharyngeal swab specimens and  should not be used as a sole basis for treatment. Nasal washings and  aspirates are unacceptable for Xpert Xpress SARS-CoV-2/FLU/RSV  testing. Fact Sheet for Patients: PinkCheek.be Fact Sheet for Healthcare Providers: GravelBags.it This test is not yet approved or cleared by the [place] FDA and  has been authorized for detection and/or diagnosis of SARS-CoV-2 by  FDA under an Emergency Use Authorization (EUA). This EUA will remain  in effect (meaning this test can be used) for the duration of the  Covid-19 declaration under Section 564(b)(1) of the Act, 21  U.S.C. section 360bbb-3(b)(1), unless the authorization is  terminated or revoked. Performed at [facility], [place] [address], [place], [place] [address]   Surgical PCR screen     Status: None   Collection Time: 10/11/19  9:35 PM   Specimen: Nasal Mucosa; Nasal Swab  Result Value Ref Range Status   MRSA, PCR NEGATIVE NEGATIVE Final   Staphylococcus aureus NEGATIVE NEGATIVE Final    Comment: (NOTE) The Xpert SA Assay (FDA approved for NASAL specimens in patients 51 years of age and older), is one component of a comprehensive surveillance program. It is not intended to diagnose infection  nor to guide or monitor treatment. Performed at [facility], [place] [address], [place], [place] [address]     Today   [place] today has no headache,no chest abdominal pain,no new weakness tingling or numbness, feels much better     Objective   Blood pressure 125/64, pulse 96, temperature 97.8 F (36.6 C), temperature source Axillary, resp. rate 18, SpO2 100 %.   Intake/Output Summary (Last 24 hours) at 10/19/2019 1006 Last data filed at 10/19/2019 0559 Gross per 24 hour  Intake 400 ml  Output 850 ml  Net -450 ml    Exam  Awake Alert,   No new F.N deficits, Normal affect Mertens.AT,PERRAL Supple Neck,No JVD, No cervical lymphadenopathy appriciated.  Symmetrical Chest wall movement, Good air movement bilaterally, CTAB RRR,No Gallops,Rubs or new Murmurs, No Parasternal Heave +ve B.Sounds, Abd Soft, Non tender, No organomegaly appriciated, No rebound -guarding or rigidity. No Cyanosis, L hip post op site stable   Data Review   CBC w Diff:  Lab Results  Component Value Date   WBC 8.3 10/19/2019   HGB 12.6 10/19/2019   HCT 38.8 10/19/2019   PLT 449 (H) 10/19/2019   LYMPHOPCT 17 10/19/2019   MONOPCT 10 10/19/2019   EOSPCT 2 10/19/2019   BASOPCT 0 10/19/2019    CMP:  Lab Results  Component Value Date   NA 139 10/19/2019   K 4.4 10/19/2019   CL 103 10/19/2019   CO2 27 10/19/2019   BUN 13 10/19/2019   CREATININE 0.57 10/19/2019   PROT 7.0 10/19/2019   ALBUMIN 2.3 (L) 10/19/2019   BILITOT 0.6 10/19/2019   ALKPHOS 48 10/19/2019   AST 48 (H) 10/19/2019   ALT 63 (H) 10/19/2019    Total Time in preparing paper work, data evaluation and todays exam - 54 minutes  Lala Lund M.D on 10/19/2019 at 10:06 AM  [facility]   Office  [phone]

## 2019-10-19 NOTE — TOC Transition Note (Signed)
Transition of Care [facility]) - CM/SW Discharge Note   Patient Details  Name: Joan Nash MRN: [id number] Date of Birth: July 25, 1946  [facility] CM/SW Contact:  Mearl Latin, LCSW Phone Number: 10/19/2019, 10:55 AM   Clinical Narrative:    Patient will DC to: [facility] Anticipated DC date: 10/19/19 Family notified: Gerarda Fraction Transport by: Sharin Mons   Please make sure signed and printed script for Haldol goes with patient.   Per MD patient ready for DC to [facility]. RN, patient, patient's family, and facility notified of DC. Discharge Summary and FL2 sent to facility. RN to call report prior to discharge [phone], ask for [employer]). DC packet on chart. Ambulance transport requested for patient.   CSW will sign off for now as social work intervention is no longer needed. Please consult [place] again if new needs arise.  Cristobal Goldmann, LCSW Clinical Social Worker [phone]    Final next level of care: Skilled Nursing Facility Barriers to Discharge: No Barriers Identified   Patient Goals and CMS Choice Patient states their goals for this hospitalization and ongoing recovery are:: Rehab CMS Medicare.gov Compare Post Acute Care list provided to:: Legal Guardian Choice offered to / list presented to : [facility] POA / Guardian(Pts Niece Cromartie,Glinda)  Discharge Placement                       Discharge Plan and Services In-house Referral: Clinical Social Work   Post Acute Care Choice: Skilled Nursing Facility                               Social Determinants of Health (SDOH) Interventions     Readmission Risk Interventions No flowsheet data found.

## 2019-10-19 NOTE — Progress Notes (Addendum)
Physical Therapy Treatment Patient Details Name: Joan Nash MRN: [id number] DOB: September 07, 1946 Today's Date: 10/19/2019    History of Present Illness 74 year old female with history of hypertension, schizophrenia, depression, dementia who is a group home resident presented with fall and was found to have left hip fracture.  Orthopedics was consulted.  COVID-19 tested positive.  Underwent direct anterior approach left hip hemiarthoplasty.     PT Comments    Pt admitted with above diagnosis. Pt was able to sit EOB with min to min guard assist and cues.  Pt max assist of 2 to stand to RW and needed max assist to move her left LE for pivot transfer as well as max assist of 2 for pivot to chair.  Pt follows simple commands well.  Updated frequency from 2x week to 3 x week as pt was getting around in group home PTA and pt is a hip fracture.  Will continue to progress pt.   Pt currently with functional limitations due to balance and endurance deficits.  Pt will benefit from skilled PT to increase their independence and safety with mobility to allow discharge to the venue listed below.     Follow Up Recommendations  SNF;Supervision/Assistance - 24 hour     Equipment Recommendations  Other (comment)(TBA)    Recommendations for Other Services       Precautions / Restrictions Precautions Precautions: Fall Restrictions Weight Bearing Restrictions: Yes LLE Weight Bearing: Weight bearing as tolerated    Mobility  Bed Mobility Overal bed mobility: Needs Assistance Bed Mobility: Supine to Sit     Supine to sit: Mod assist;HOB elevated;+2 for safety/equipment     General bed mobility comments: multimodal cues required, assist to initiate movement - Pt responds well to simple direct instructions  Transfers Overall transfer level: Needs assistance Equipment used: Rolling walker (2 wheeled) Transfers: Sit to/from [facility] Sit to Stand: Max assist;+2 physical assistance;From  elevated surface Stand pivot transfers: Max assist;+2 physical assistance       General transfer comment: sit<>stand with RW max A +2 with simple, firm, instructions. Pt required max A to move LLE for pivotal movements, and maximum multimodal cues. Recommend that RN staff use Charlaine Dalton (and written on board)  Ambulation/Gait                 Stairs             Wheelchair Mobility    Modified Rankin (Stroke Patients Only)       Balance Overall balance assessment: Needs assistance Sitting-balance support: Feet supported Sitting balance-Leahy Scale: Fair Sitting balance - Comments: initially with strong posterior left lean, with 2x leaning on right elbow and cues to correct, Pt able to sit min guard EOB for approx 10 min for ADL tasks.  Pt washed her hands and face a cloth.  Postural control: Posterior lean;Left lateral lean(initially, with improvement) Standing balance support: Bilateral upper extremity supported Standing balance-Leahy Scale: Poor Standing balance comment: Needed assist max and RW for dynamic and static stance.                             Cognition Arousal/Alertness: Awake/alert Behavior During Therapy: WFL for tasks assessed/performed Overall Cognitive Status: History of cognitive impairments - at baseline                                 General Comments:  Patient slow to follow commands, needs multimodal cues and assist      Exercises General Exercises - Lower Extremity Ankle Circles/Pumps: AAROM;AROM;5 reps;Supine Heel Slides: AAROM;5 reps;Supine;Both;AROM    General Comments        Pertinent Vitals/Pain Pain Assessment: Faces Faces Pain Scale: Hurts little more Pain Location: left LE with movement/WB Pain Descriptors / Indicators: Grimacing;Guarding Pain Intervention(s): Limited activity within patient's tolerance;Monitored during session;Repositioned    Home Living                      Prior Function             PT Goals (current goals can now be found in the care plan section) Acute Rehab PT Goals Patient Stated Goal: did not state Progress towards PT goals: Progressing toward goals    Frequency    Min 3X/week      PT Plan Frequency needs to be updated    Co-evaluation PT/OT/SLP Co-Evaluation/Treatment: Yes Reason for Co-Treatment: Complexity of the patient's impairments (multi-system involvement);For patient/therapist safety PT goals addressed during session: Mobility/safety with mobility OT goals addressed during session: ADL's and self-care;Proper use of Adaptive equipment and DME;Strengthening/ROM      AM-PAC PT "6 Clicks" Mobility   Outcome Measure  Help needed turning from your back to your side while in a flat bed without using bedrails?: Total Help needed moving from lying on your back to sitting on the side of a flat bed without using bedrails?: Total Help needed moving to and from a bed to a chair (including a wheelchair)?: Total Help needed standing up from a chair using your arms (e.g., wheelchair or bedside chair)?: Total Help needed to walk in hospital room?: Total Help needed climbing 3-5 steps with a railing? : Total 6 Click Score: 6    End of Session Equipment Utilized During Treatment: Gait belt Activity Tolerance: Patient limited by fatigue Patient left: with call bell/phone within reach;in chair;with chair alarm set Nurse Communication: Mobility status;Need for lift equipment PT Visit Diagnosis: Unsteadiness on feet (R26.81);Muscle weakness (generalized) (M62.81);Pain Pain - Right/Left: Left Pain - part of body: Hip     Time: 3382-5053 PT Time Calculation (min) (ACUTE ONLY): 26 min  Charges:  $Therapeutic Activity: 8-22 mins                     Shalinda Burkholder W,PT Acute Rehabilitation Services Pager:  [phone]  Office:  [phone]     Berline Lopes 10/19/2019, 1:19 PM

## 2019-10-19 NOTE — Progress Notes (Signed)
Pt transferred from unit to [facility] via [place].

## 2019-10-19 NOTE — Care Management Important Message (Signed)
Important Message  Patient Details  Name: Benita Boonstra MRN: [id number] Date of Birth: [birth year]   Medicare Important Message Given:  Yes - Important Message mailed due to current National Emergency  Verbal consent obtained due to current National Emergency  Relationship to patient: Other [employer] (must comment) Contact Name: Jimmye Norman Call Date: 10/19/19  Time: 1050 Phone: [phone] Outcome: Spoke with contact Important Message mailed to: Patient address on file    Orson Aloe 10/19/2019, 10:51 AM

## 2019-10-19 NOTE — Progress Notes (Signed)
Occupational Therapy Treatment Patient Details Name: Joan Nash MRN: [id number] DOB: Jul 11, 1946 Today's Date: 10/19/2019    History of present illness 74 year old female with history of hypertension, schizophrenia, depression, dementia who is a group home resident presented with fall and was found to have left hip fracture.  Orthopedics was consulted.  COVID-19 tested positive.  Underwent direct anterior approach left hip hemiarthoplasty.    OT comments  Pt progressing towards OT goals this session, Pt pleasant and cooperative and responded well to simple, firm directions. Pt was mod A +2 safety to come to EOB where initially she required mod A for seated balance - after leaning on R elbow x2 was able to sit min guard EOB for grooming and ADL task for approx 10 min. Pt then was max A +2 assist - Pt requiring assist to advance L leg for pivot to recliner. RN staff advised to use Stedy. Once in recliner, Pt min A for self-feeding. Pt eating 2 apple sauces. OT plan continues to be appropriate.    Follow Up Recommendations  SNF;Supervision/Assistance - 24 hour    Equipment Recommendations  3 in 1 bedside commode    Recommendations for Other Services      Precautions / Restrictions Precautions Precautions: Fall Restrictions Weight Bearing Restrictions: Yes LLE Weight Bearing: Weight bearing as tolerated       Mobility Bed Mobility Overal bed mobility: Needs Assistance Bed Mobility: Supine to Sit     Supine to sit: Mod assist;HOB elevated;+2 for safety/equipment     General bed mobility comments: multimodal cues required, assist to initiate movement - Pt responds well to simple direct instructions  Transfers Overall transfer level: Needs assistance Equipment used: Rolling walker (2 wheeled) Transfers: Sit to/from [employer] Sit to Stand: Max assist;+2 physical assistance;From elevated surface Stand pivot transfers: Max assist;+2 physical assistance        General transfer comment: sit<>stand with RW max A +2 with simple, firm, instructions. Pt required max A to move LLE for pivotal movements, and maximum multimodal cues. Recommend that RN staff use Antony Salmon (and written on board)    Balance Overall balance assessment: Needs assistance Sitting-balance support: Feet supported Sitting balance-Leahy Scale: Fair Sitting balance - Comments: initially with strong posterior left lean, with 2x leaning on right elbow and cues to correct, Pt able to sit min guard EOB for approx 10 min for ADL tasks Postural control: Posterior lean;Left lateral lean(initially, with improvement) Standing balance support: Bilateral upper extremity supported Standing balance-Leahy Scale: Poor                             ADL either performed or assessed with clinical judgement   ADL Overall ADL's : Needs assistance/impaired Eating/Feeding: Minimal assistance;Sitting Eating/Feeding Details (indicate cue type and reason): assist to open containers and set up - but able to feed herself - ate 2 applesauces  Grooming: Set up;Wash/dry face;Wash/dry hands;Sitting Grooming Details (indicate cue type and reason): EOB, multimodal cues for posture         Upper Body Dressing : Minimal assistance;Sitting Upper Body Dressing Details (indicate cue type and reason): EOB, new gown     Toilet Transfer: Maximal assistance;+2 for physical assistance;+2 for safety/equipment;Stand-pivot;BSC;RW Toilet Transfer Details (indicate cue type and reason): simulated through recliner transfer         Functional mobility during ADLs: Maximal assistance;+2 for physical assistance;+2 for safety/equipment;Rolling walker;Cueing for sequencing General ADL Comments: pt limited by cognition, impaired balance,  weakness, pain      Vision       Perception     Praxis      Cognition Arousal/Alertness: Awake/alert Behavior During Therapy: WFL for tasks assessed/performed Overall  Cognitive Status: History of cognitive impairments - at baseline                                 General Comments: Patient slow to follow commands, needs multimodal cues and assist        Exercises     Shoulder Instructions       General Comments      Pertinent Vitals/ Pain       Pain Assessment: Faces Faces Pain Scale: Hurts little more Pain Location: left LE with movement/WB Pain Descriptors / Indicators: Grimacing;Guarding Pain Intervention(s): Monitored during session;Repositioned  Home Living                                          Prior Functioning/Environment              Frequency  Min 2X/week        Progress Toward Goals  OT Goals(current goals can now be found in the care plan section)  Progress towards OT goals: Progressing toward goals  Acute Rehab OT Goals Patient Stated Goal: did not state OT Goal Formulation: Patient unable to participate in goal setting Time For Goal Achievement: 10/27/19 Potential to Achieve Goals: Good  Plan Discharge plan remains appropriate;Frequency remains appropriate    Co-evaluation    PT/OT/SLP Co-Evaluation/Treatment: Yes Reason for Co-Treatment: Complexity of the patient's impairments (multi-system involvement);Necessary to address cognition/behavior during functional activity;For patient/therapist safety;To address functional/ADL transfers PT goals addressed during session: Mobility/safety with mobility;Balance;Proper use of DME;Strengthening/ROM OT goals addressed during session: ADL's and self-care;Proper use of Adaptive equipment and DME;Strengthening/ROM      AM-PAC OT "6 Clicks" Daily Activity     Outcome Measure   Help from another person eating meals?: A Little Help from another person taking care of personal grooming?: A Little Help from another person toileting, which includes using toliet, bedpan, or urinal?: Total Help from another person bathing (including  washing, rinsing, drying)?: A Lot Help from another person to put on and taking off regular upper body clothing?: A Little Help from another person to put on and taking off regular lower body clothing?: Total 6 Click Score: 13    End of Session Equipment Utilized During Treatment: Gait belt;Rolling walker  OT Visit Diagnosis: Other abnormalities of gait and mobility (R26.89);Muscle weakness (generalized) (M62.81);History of falling (Z91.81);Pain;Other symptoms and signs involving cognitive function Pain - Right/Left: Left Pain - part of body: Hip;Leg   Activity Tolerance Patient tolerated treatment well   Patient Left in chair;with call bell/phone within reach;with chair alarm set   Nurse Communication Mobility status(RN staff use Stedy)        Time: 0350-0938 OT Time Calculation (min): 26 min  Charges: OT General Charges $OT Visit: 1 Visit OT Treatments $Self Care/Home Management : 8-22 mins  Jesse Sans OTR/L Acute Rehabilitation Services Pager: [phone] Office: [place] 10/19/2019, 12:22 PM

## 2019-10-19 NOTE — TOC Progression Note (Signed)
Transition of Care [facility]) - Progression Note    Patient Details  Name: Joan Nash MRN: [id number] Date of Birth: 09-05-1946  [facility] of [facility]) CM/SW Contact  Mearl Latin, LCSW Phone Number: 10/19/2019, 9:03 AM  Clinical Narrative:    [facility] has one bed left for patient if stable for discharge today.    Expected Discharge Plan: Skilled Nursing Facility    Expected Discharge Plan and Services Expected Discharge Plan: Skilled Nursing Facility       Living arrangements for the past 2 months: Group Home                                       Social Determinants of Health (SDOH) Interventions    Readmission Risk Interventions No flowsheet data found.

## 2019-10-19 NOTE — Progress Notes (Signed)
Report called to [facility] at [phone] and given to Doree Barthel, RN.

## 2019-10-20 ENCOUNTER — Telehealth: Payer: Self-pay | Admitting: Orthopaedic Surgery

## 2019-10-20 NOTE — Telephone Encounter (Signed)
Kathie Rhodes with the [place] office called. Kathie Rhodes states that the pt has covid and is wondering if and when Dr. Roda Shutters wants the staples removed that were put in 10-11-19.   Kathie Rhodes cell# [phone] Fax# [phone]

## 2019-10-20 NOTE — Telephone Encounter (Signed)
Joan Nash aware of the below message

## 2019-10-20 NOTE — Telephone Encounter (Signed)
2 weeks post-op

## 2019-12-01 ENCOUNTER — Ambulatory Visit (INDEPENDENT_AMBULATORY_CARE_PROVIDER_SITE_OTHER): Payer: Medicare Other

## 2019-12-01 ENCOUNTER — Encounter: Payer: Self-pay | Admitting: Orthopaedic Surgery

## 2019-12-01 ENCOUNTER — Ambulatory Visit (INDEPENDENT_AMBULATORY_CARE_PROVIDER_SITE_OTHER): Payer: Medicare Other | Admitting: Orthopaedic Surgery

## 2019-12-01 ENCOUNTER — Other Ambulatory Visit: Payer: Self-pay

## 2019-12-01 DIAGNOSIS — Z96642 Presence of left artificial hip joint: Secondary | ICD-10-CM

## 2019-12-01 NOTE — Progress Notes (Signed)
   Post-Op Visit Note   Patient: Joan Nash           Date of Birth: 27-Feb-1946           MRN: [id number] Visit Date: 12/01/2019 PCP: Arnette Felts, FNP   Assessment & Plan:  Chief Complaint:  Chief Complaint  Patient presents with  . Left Hip - Pain   Visit Diagnoses:  1. Status post left hip replacement     Plan: Patient is a pleasant 74 year old female who comes in today 7 weeks out left hip hemiarthroplasty from a femoral neck fracture 10/12/2019.  She has been doing okay.  She was transferred to [facility] postoperatively due to Covid.  She has been at the [facility] for the past 2 weeks.  She has been working with physical therapy.  She has been ambulating in a wheelchair.  She is not complaining of much pain today.  Examination of left hip reveals a fully healed surgical scar without complication.  She is able to flex her hip without pain.  Calf soft nontender.  She is neurovascular intact distally.  At this point, she will continue with physical therapy.  She will follow-up with [place] in 5 weeks time for repeat evaluation AP pelvis lateral left hip x-rays.  Call with concerns or questions in meantime.  Follow-Up Instructions: Return in about 5 weeks (around 01/05/2020).   Orders:  Orders Placed This Encounter  Procedures  . XR HIP UNILAT W OR W/O PELVIS 2-3 VIEWS LEFT   No orders of the defined types were placed in this encounter.   Imaging: XR HIP UNILAT W OR W/O PELVIS 2-3 VIEWS LEFT  Result Date: 12/01/2019 Well-seated prosthesis without complication   PMFS History: Patient Active Problem List   Diagnosis Date Noted  . Left displaced femoral neck fracture (HCC) 10/11/2019  . Fall   . Dementia with behavioral disturbance (HCC) 04/12/2016  . Encounter for preadmission testing   . Schizoaffective disorder (HCC) 01/29/2015   Past Medical History:  Diagnosis Date  . Depression   . Diabetes mellitus   . Hypercholesterolemia   . Hypertension   . Schizophrenia  (HCC)     History reviewed. No pertinent family history.  Past Surgical History:  Procedure Laterality Date  . ABDOMINAL HYSTERECTOMY    . ANTERIOR APPROACH HEMI HIP ARTHROPLASTY Left 10/12/2019   Procedure: ANTERIOR APPROACH HEMI HIP ARTHROPLASTY;  Surgeon: Tarry Kos, MD;  Location: [facility] OR;  Service: Orthopedics;  Laterality: Left;  . TOOTH EXTRACTION     Social History   Occupational History  . Not on file  Tobacco Use  . Smoking status: Never Smoker  . Smokeless tobacco: Never Used  Substance and Sexual Activity  . Alcohol use: No  . Drug use: No  . Sexual activity: Not on file

## 2020-01-02 ENCOUNTER — Encounter: Payer: Self-pay | Admitting: Orthopaedic Surgery

## 2020-01-02 ENCOUNTER — Ambulatory Visit (INDEPENDENT_AMBULATORY_CARE_PROVIDER_SITE_OTHER): Payer: Medicare Other

## 2020-01-02 ENCOUNTER — Ambulatory Visit (INDEPENDENT_AMBULATORY_CARE_PROVIDER_SITE_OTHER): Payer: Medicare Other | Admitting: Orthopaedic Surgery

## 2020-01-02 ENCOUNTER — Other Ambulatory Visit: Payer: Self-pay

## 2020-01-02 DIAGNOSIS — Z96642 Presence of left artificial hip joint: Secondary | ICD-10-CM

## 2020-01-02 NOTE — Progress Notes (Signed)
   Post-Op Visit Note   Patient: Joan Nash           Date of Birth: [birth year]           MRN: [id number] Visit Date: 01/02/2020 PCP: Arnette Felts, FNP   Assessment & Plan:  Chief Complaint:  Chief Complaint  Patient presents with  . Left Hip - Pain   Visit Diagnoses:  1. Status post left hip replacement     Plan: Nea is approximately 3 months status post left hip hemiarthroplasty.  She is doing well overall.  She has dementia which limits HPI.  Her surgical scar is fully healed.  No pain with hip range of motion.  X-rays are unremarkable.  At this point she is doing well and we will release her to physical therapy.  Follow-up as needed.  Follow-Up Instructions: Return if symptoms worsen or fail to improve.   Orders:  Orders Placed This Encounter  Procedures  . XR HIP UNILAT W OR W/O PELVIS 2-3 VIEWS LEFT   No orders of the defined types were placed in this encounter.   Imaging: XR HIP UNILAT W OR W/O PELVIS 2-3 VIEWS LEFT  Result Date: 01/02/2020 Stable left partial hip replacement without complication.   PMFS History: Patient Active Problem List   Diagnosis Date Noted  . Left displaced femoral neck fracture (HCC) 10/11/2019  . Fall   . Dementia with behavioral disturbance (HCC) 04/12/2016  . Encounter for preadmission testing   . Schizoaffective disorder (HCC) 01/29/2015   Past Medical History:  Diagnosis Date  . Depression   . Diabetes mellitus   . Hypercholesterolemia   . Hypertension   . Schizophrenia (HCC)     History reviewed. No pertinent family history.  Past Surgical History:  Procedure Laterality Date  . ABDOMINAL HYSTERECTOMY    . ANTERIOR APPROACH HEMI HIP ARTHROPLASTY Left 10/12/2019   Procedure: ANTERIOR APPROACH HEMI HIP ARTHROPLASTY;  Surgeon: Tarry Kos, MD;  Location: [facility] OR;  Service: Orthopedics;  Laterality: Left;  . TOOTH EXTRACTION     Social History   Occupational History  . Not on file  Tobacco Use  . Smoking  status: Never Smoker  . Smokeless tobacco: Never Used  Substance and Sexual Activity  . Alcohol use: No  . Drug use: No  . Sexual activity: Not on file

## 2020-01-05 ENCOUNTER — Ambulatory Visit: Payer: Medicare Other | Admitting: Orthopaedic Surgery

## 2021-04-20 IMAGING — CT CT CERVICAL SPINE W/O CM
3 of 4 series · 11 of 33 positions shown, 13 images · non-contrast
Comparison: None.

CLINICAL DATA: Trauma, fall from bed

EXAM:
CT HEAD WITHOUT CONTRAST
CT CERVICAL SPINE WITHOUT CONTRAST
TECHNIQUE: Multidetector CT imaging of the head and cervical spine was
performed following the standard protocol without intravenous
contrast. Multiplanar CT image reconstructions of the cervical spine
were also generated.

[Series 4: c_spine 2.0 st · axial · 0.34mm/px · z∈[-285,-141]mm · 3 of 110 slices shown, 4 images]
[im 19/110  soft-tissue]
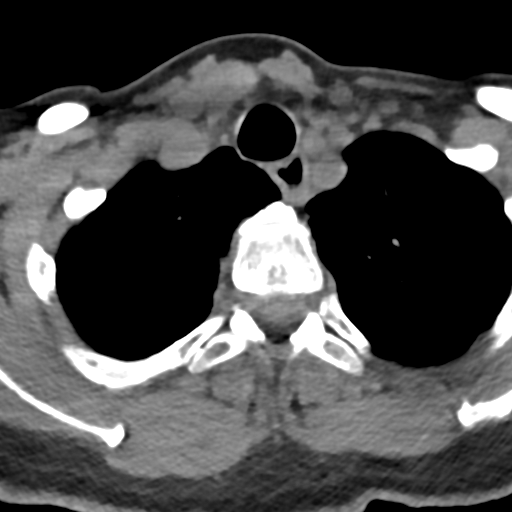
[im 19/110  bone]
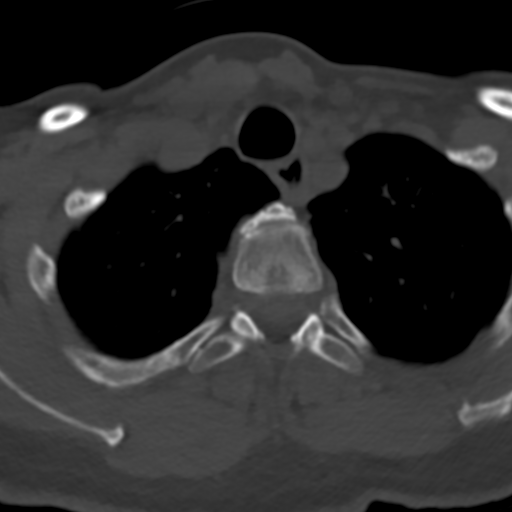
[im 55/110  bone]
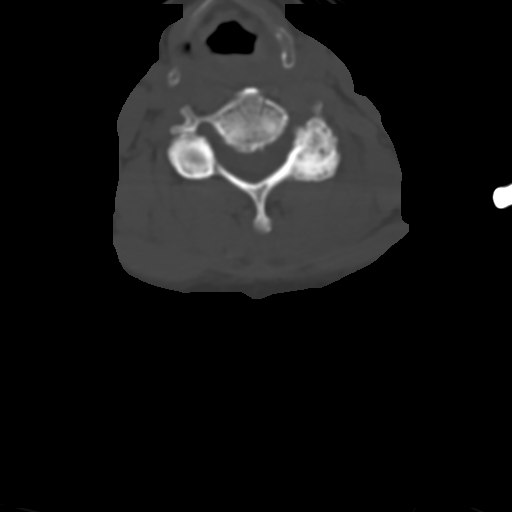
[im 91/110  bone]
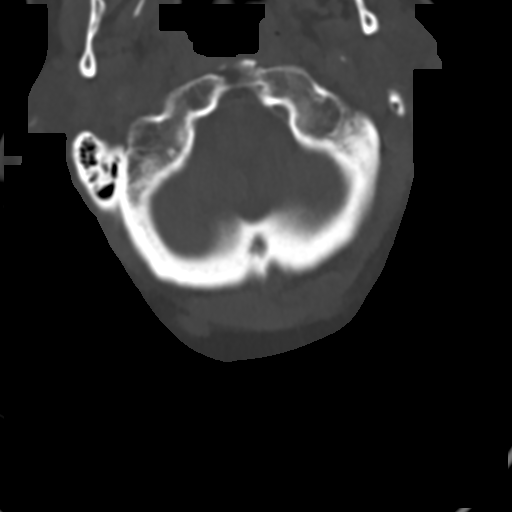

[Series 7: coronal bone · coronal · 0.27mm/px · 3 of 61 slices shown]
[im 13/61  bone]
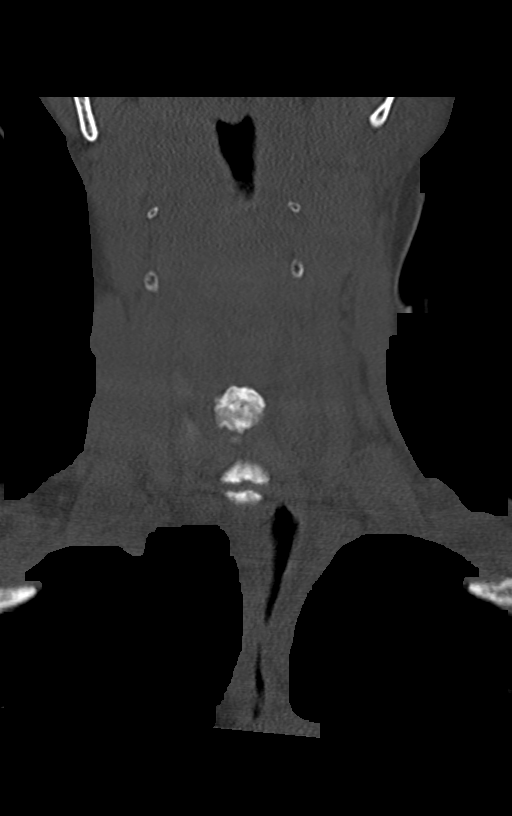
[im 25/61  bone]
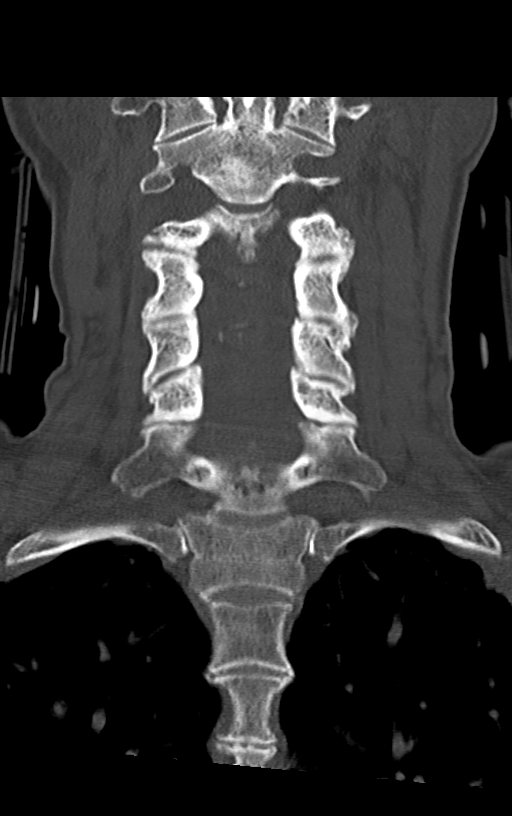
[im 37/61  bone]
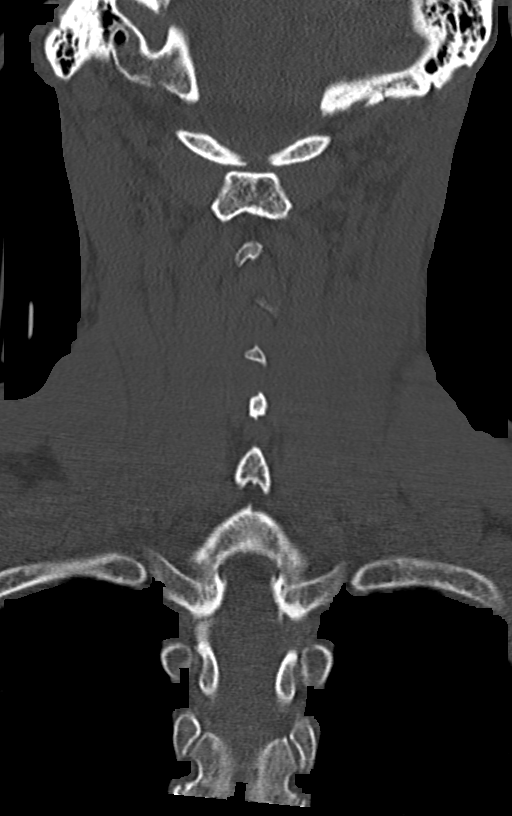

[Series 8: sagittal bone · sagittal · 0.26mm/px · 5 of 61 slices shown, 6 images]
[im 21/61  bone]
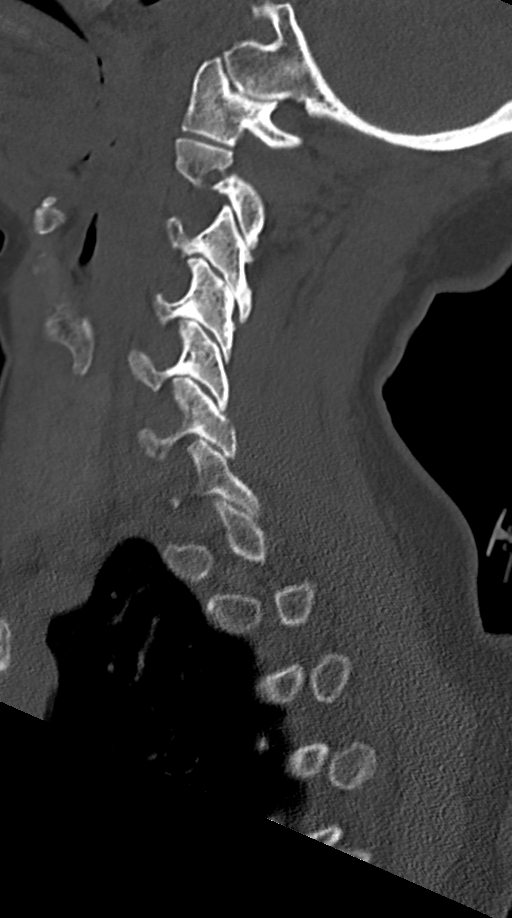
[im 26/61  bone]
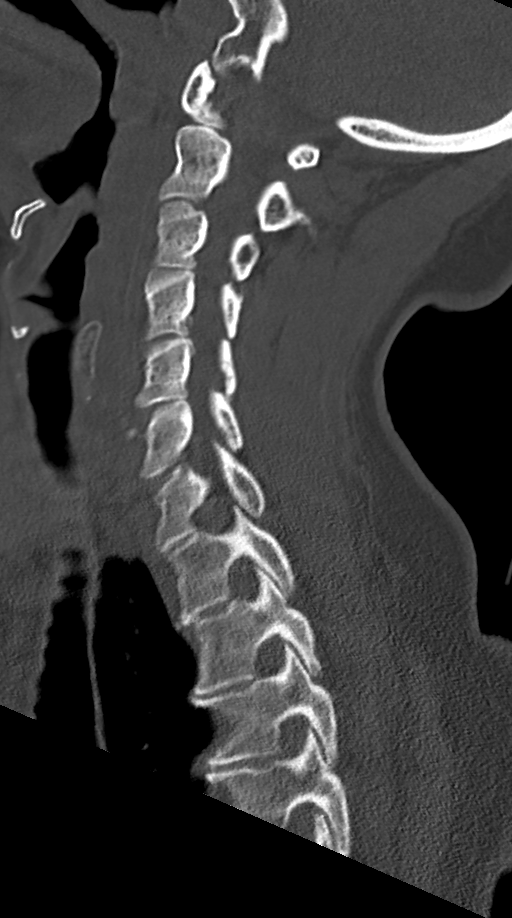
[im 31/61  soft-tissue]
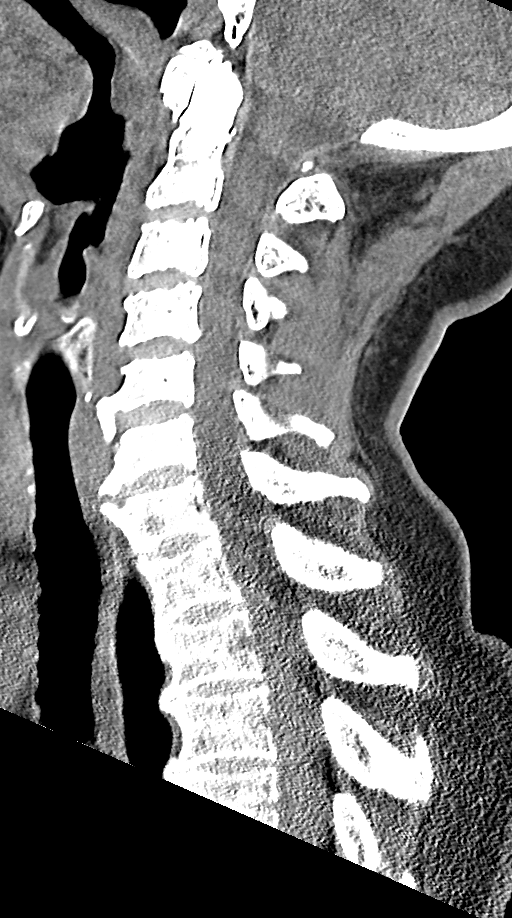
[im 31/61  bone]
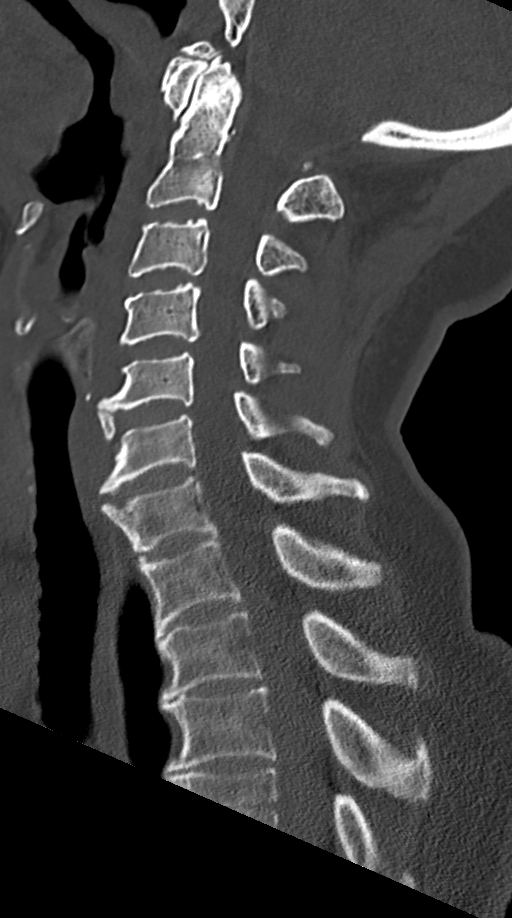
[im 36/61  bone]
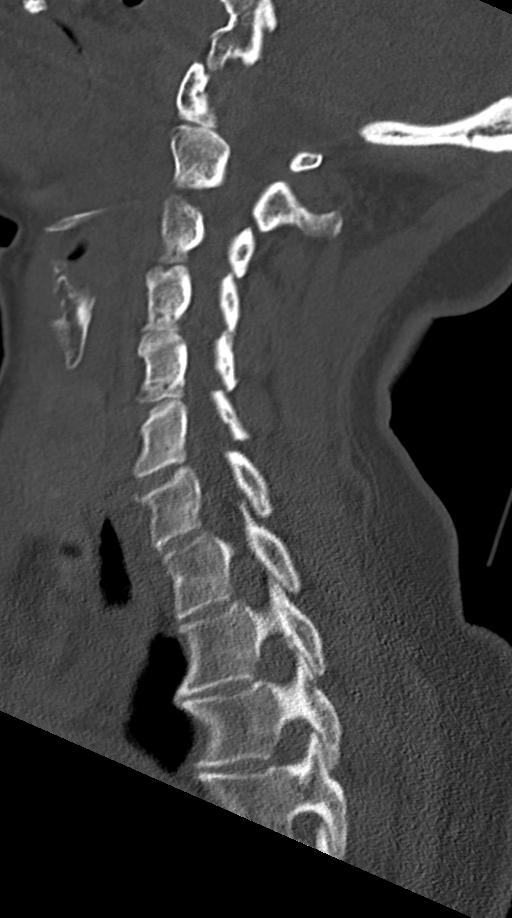
[im 41/61  bone]
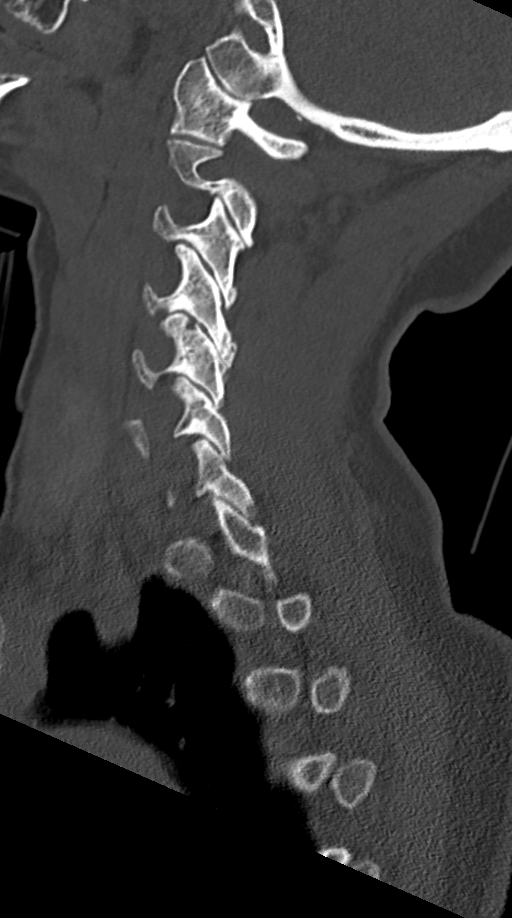

[11 of 33 positions shown; findings below may reference images not displayed]

FINDINGS: CT HEAD FINDINGS

Brain: No evidence of acute infarction, hemorrhage, hydrocephalus,
extra-axial collection or mass lesion/mass effect. Periventricular
white matter hypodensity.

Vascular: No hyperdense vessel or unexpected calcification.

Skull: Normal. Negative for fracture or focal lesion.

Sinuses/Orbits: No acute finding.

Other: None.

CT CERVICAL SPINE FINDINGS

Alignment: Normal.

Skull base and vertebrae: No acute fracture. No primary bone lesion
or focal pathologic process. Incidental note of congenital variant
posterior nonunion of C1.

Soft tissues and spinal canal: No prevertebral fluid or swelling. No
visible canal hematoma.

Disc levels: Moderate disc space height loss and osteophytosis of
the lower cervical spine.

Upper chest: Negative.

Other: None.
IMPRESSION: 1. No acute intracranial pathology. Small-vessel white matter
disease.
2. No fracture or static subluxation of the cervical spine.
